# Patient Record
Sex: Male | Born: 1950
Health system: Southern US, Community
[De-identification: ages and names within clinical notes are randomized; demographics above are authoritative.]

## PROBLEM LIST (undated history)

## (undated) DIAGNOSIS — I10 Essential (primary) hypertension: Secondary | ICD-10-CM

## (undated) HISTORY — DX: Essential (primary) hypertension: I10

## (undated) HISTORY — PX: SPINE SURGERY: SHX786

---

## 1974-04-29 HISTORY — PX: APPENDECTOMY: SHX54

## 1994-04-29 HISTORY — PX: BACK SURGERY: SHX140

## 2011-02-11 ENCOUNTER — Encounter: Payer: Self-pay | Admitting: Gastroenterology

## 2011-03-07 ENCOUNTER — Ambulatory Visit (AMBULATORY_SURGERY_CENTER): Payer: BC Managed Care – PPO | Admitting: *Deleted

## 2011-03-07 ENCOUNTER — Encounter: Payer: Self-pay | Admitting: Gastroenterology

## 2011-03-07 VITALS — Ht 70.0 in | Wt 175.0 lb

## 2011-03-07 DIAGNOSIS — Z1211 Encounter for screening for malignant neoplasm of colon: Secondary | ICD-10-CM

## 2011-03-07 MED ORDER — PEG-KCL-NACL-NASULF-NA ASC-C 100 G PO SOLR: ORAL | Status: DC

## 2011-03-18 ENCOUNTER — Encounter: Payer: Self-pay | Admitting: Gastroenterology

## 2011-03-18 ENCOUNTER — Ambulatory Visit (AMBULATORY_SURGERY_CENTER): Payer: BC Managed Care – PPO | Admitting: Gastroenterology

## 2011-03-18 VITALS — BP 160/98 | HR 77 | Temp 97.2°F | Resp 14 | Ht 70.0 in | Wt 175.0 lb

## 2011-03-18 DIAGNOSIS — Z1211 Encounter for screening for malignant neoplasm of colon: Secondary | ICD-10-CM

## 2011-03-18 MED ORDER — SODIUM CHLORIDE 0.9 % IV SOLN: 500.0000 mL | INTRAVENOUS | Status: DC

## 2011-03-18 NOTE — Patient Instructions (Signed)
PLEASE FOLLOW DISCHARGE INSTRUCTIONS GIVEN TODAY. NO DRINKING, NO WORKING, NO DRIVING REST OF TODAY. RESUME NORMAL ACTIVITY TOMORROW. SEE HANDOUTS. RESUME CURRENT MEDICATIONS. CALL us WITH ANY QUESTIONS OR CONCERNS. THANK YOU!!

## 2011-03-18 NOTE — Progress Notes (Signed)
Patient did not experience any of the following events: a burn prior to discharge; a fall within the facility; wrong site/side/patient/procedure/implant event; or a hospital transfer or hospital admission upon discharge from the facility. (G8907) Patient did not have preoperative order for IV antibiotic SSI prophylaxis. (G8918)  

## 2011-03-19 ENCOUNTER — Telehealth: Payer: Self-pay

## 2011-03-19 NOTE — Telephone Encounter (Signed)

## 2012-01-06 ENCOUNTER — Other Ambulatory Visit: Payer: Self-pay | Admitting: Family Medicine

## 2012-01-06 DIAGNOSIS — R252 Cramp and spasm: Secondary | ICD-10-CM

## 2012-01-10 ENCOUNTER — Ambulatory Visit (HOSPITAL_COMMUNITY)
Admission: RE | Admit: 2012-01-10 | Discharge: 2012-01-10 | Disposition: A | Discharge status: Home / Self Care | Payer: BC Managed Care – PPO | Source: Ambulatory Visit | Attending: Family Medicine | Admitting: Family Medicine

## 2012-01-10 DIAGNOSIS — I739 Peripheral vascular disease, unspecified: Secondary | ICD-10-CM | POA: Insufficient documentation

## 2012-01-10 DIAGNOSIS — R252 Cramp and spasm: Secondary | ICD-10-CM

## 2012-01-10 DIAGNOSIS — M79609 Pain in unspecified limb: Secondary | ICD-10-CM | POA: Insufficient documentation

## 2013-03-24 ENCOUNTER — Other Ambulatory Visit: Payer: Self-pay | Admitting: Family Medicine

## 2013-09-09 ENCOUNTER — Encounter: Payer: Self-pay | Admitting: Nurse Practitioner

## 2013-09-09 ENCOUNTER — Telehealth: Payer: Self-pay | Admitting: Family Medicine

## 2013-09-09 ENCOUNTER — Ambulatory Visit (INDEPENDENT_AMBULATORY_CARE_PROVIDER_SITE_OTHER): Payer: BC Managed Care – PPO | Admitting: Nurse Practitioner

## 2013-09-09 VITALS — BP 164/76 | HR 78 | Temp 98.2°F | Ht 70.0 in | Wt 186.0 lb

## 2013-09-09 DIAGNOSIS — J019 Acute sinusitis, unspecified: Secondary | ICD-10-CM

## 2013-09-09 MED ORDER — AZITHROMYCIN 250 MG PO TABS: ORAL_TABLET | ORAL | Status: DC

## 2013-09-09 NOTE — Progress Notes (Signed)
   Subjective:    Patient ID: Christopher Herring, male    DOB: 1950-05-19, 63 y.o.   MRN: [id number]  HPI Patient in today c/o cough and congestion- Started 1 week ago- a little better but still feels bad.    Review of Systems  Constitutional: Positive for fatigue. Negative for fever and chills.  HENT: Positive for congestion, postnasal drip, rhinorrhea and sinus pressure. Negative for sore throat, trouble swallowing and voice change.   Respiratory: Positive for cough (clear).   Cardiovascular: Negative.   Genitourinary: Negative.   Neurological: Negative.   Psychiatric/Behavioral: Negative.        Objective:   Physical Exam  Constitutional: He is oriented to person, place, and time. He appears well-developed and well-nourished.  HENT:  Right Ear: Hearing, tympanic membrane, external ear and ear canal normal.  Left Ear: Hearing, tympanic membrane, external ear and ear canal normal.  Nose: Mucosal edema and rhinorrhea present. Right sinus exhibits maxillary sinus tenderness. Right sinus exhibits no frontal sinus tenderness. Left sinus exhibits maxillary sinus tenderness. Left sinus exhibits no frontal sinus tenderness.  Mouth/Throat: Uvula is midline, oropharynx is clear and moist and mucous membranes are normal.  Eyes: Pupils are equal, round, and reactive to light.  Neck: Normal range of motion. Neck supple.  Cardiovascular: Normal rate, regular rhythm and normal heart sounds.   Pulmonary/Chest: Effort normal and breath sounds normal.  Deep dry cough  Abdominal: Soft. Bowel sounds are normal.  Lymphadenopathy:    He has no cervical adenopathy.  Neurological: He is alert and oriented to person, place, and time.  Skin: Skin is warm and dry.  Psychiatric: He has a normal mood and affect. His behavior is normal. Judgment and thought content normal.   BP 164/76  Pulse 78  Temp(Src) 98.2 F (36.8 C) (Oral)  Ht 5\' 10"  (1.778 m)  Wt 186 lb (84.369 kg)  BMI 26.69 kg/m2          Assessment & Plan:   1. Acute sinusitis    Meds ordered this encounter  Medications  . acyclovir (ZOVIRAX) 400 MG tablet    Sig: Take 400 mg by mouth 2 (two) times daily as needed.  Marland Kitchen azithromycin (ZITHROMAX Z-PAK) 250 MG tablet    Sig: As directed    Dispense:  6 each    Refill:  0    Order Specific Question:  Supervising Provider    Answer:  Chipper Herb [[id number]]   1. Take meds as prescribed 2. Use a cool mist humidifier especially during the winter months and when heat has been humid. 3. Use saline nose sprays frequently 4. Saline irrigations of the nose can be very helpful if done frequently.  * 4X daily for 1 week*  * Use of a nettie pot can be helpful with this. Follow directions with this* 5. Drink plenty of fluids 6. Keep thermostat turn down low 7.For any cough or congestion  Use plain Mucinex- regular strength or max strength is fine   * Children- consult with Pharmacist for dosing 8. For fever or aces or pains- take tylenol or ibuprofen appropriate for age and weight.  * for fevers greater than 101 orally you may alternate ibuprofen and tylenol every  3 hours.   Mary-Margaret Hassell Done, FNP

## 2013-09-09 NOTE — Patient Instructions (Signed)

## 2013-09-09 NOTE — Telephone Encounter (Signed)
appt made

## 2014-03-18 ENCOUNTER — Telehealth: Payer: Self-pay | Admitting: Nurse Practitioner

## 2014-03-18 MED ORDER — ACYCLOVIR 400 MG PO TABS: 400.0000 mg | ORAL_TABLET | Freq: Two times a day (BID) | ORAL | Status: DC | PRN

## 2014-03-18 NOTE — Telephone Encounter (Signed)
Stp, no ear drops on file, advised pt we can give enough refills on acyclovir to get him through till he sees dr Sabra Heck but he would need to be evaluated for the ear pain to get any type of ear drops, pt states he will just use salve on ear and will CB if it gets worse.

## 2014-06-01 ENCOUNTER — Ambulatory Visit: Payer: BC Managed Care – PPO | Admitting: Family Medicine

## 2015-01-05 ENCOUNTER — Ambulatory Visit (INDEPENDENT_AMBULATORY_CARE_PROVIDER_SITE_OTHER): Payer: Medicare Other | Admitting: Family Medicine

## 2015-01-05 ENCOUNTER — Encounter: Payer: Self-pay | Admitting: Family Medicine

## 2015-01-05 ENCOUNTER — Encounter (INDEPENDENT_AMBULATORY_CARE_PROVIDER_SITE_OTHER): Payer: Self-pay

## 2015-01-05 VITALS — BP 123/80 | HR 78 | Temp 98.0°F | Ht 70.0 in | Wt 177.2 lb

## 2015-01-05 DIAGNOSIS — Z125 Encounter for screening for malignant neoplasm of prostate: Secondary | ICD-10-CM | POA: Insufficient documentation

## 2015-01-05 DIAGNOSIS — Z8249 Family history of ischemic heart disease and other diseases of the circulatory system: Secondary | ICD-10-CM

## 2015-01-05 DIAGNOSIS — N400 Enlarged prostate without lower urinary tract symptoms: Secondary | ICD-10-CM | POA: Diagnosis not present

## 2015-01-05 DIAGNOSIS — R002 Palpitations: Secondary | ICD-10-CM | POA: Diagnosis not present

## 2015-01-05 DIAGNOSIS — Z716 Tobacco abuse counseling: Secondary | ICD-10-CM | POA: Diagnosis not present

## 2015-01-05 DIAGNOSIS — Z13220 Encounter for screening for lipoid disorders: Secondary | ICD-10-CM | POA: Insufficient documentation

## 2015-01-05 DIAGNOSIS — Z720 Tobacco use: Secondary | ICD-10-CM

## 2015-01-05 DIAGNOSIS — Z0000 Encounter for general adult medical examination without abnormal findings: Secondary | ICD-10-CM | POA: Insufficient documentation

## 2015-01-05 DIAGNOSIS — Z131 Encounter for screening for diabetes mellitus: Secondary | ICD-10-CM | POA: Insufficient documentation

## 2015-01-05 MED ORDER — VARENICLINE TARTRATE 0.5 MG X 11 & 1 MG X 42 PO MISC: ORAL | Status: DC

## 2015-01-06 ENCOUNTER — Other Ambulatory Visit (INDEPENDENT_AMBULATORY_CARE_PROVIDER_SITE_OTHER): Payer: Medicare Other

## 2015-01-06 DIAGNOSIS — N400 Enlarged prostate without lower urinary tract symptoms: Secondary | ICD-10-CM | POA: Diagnosis not present

## 2015-01-06 DIAGNOSIS — R002 Palpitations: Secondary | ICD-10-CM | POA: Diagnosis not present

## 2015-01-06 DIAGNOSIS — Z136 Encounter for screening for cardiovascular disorders: Secondary | ICD-10-CM | POA: Diagnosis not present

## 2015-01-06 DIAGNOSIS — Z8249 Family history of ischemic heart disease and other diseases of the circulatory system: Secondary | ICD-10-CM

## 2015-01-06 DIAGNOSIS — E785 Hyperlipidemia, unspecified: Secondary | ICD-10-CM

## 2015-01-06 NOTE — Progress Notes (Signed)
Lab only 

## 2015-01-07 LAB — LIPID PANEL
CHOLESTEROL TOTAL: 235 mg/dL — AB (ref 100–199)
Chol/HDL Ratio: 3.9 ratio units (ref 0.0–5.0)
HDL: 61 mg/dL (ref >39)
LDL Calculated: 149 mg/dL — ABNORMAL HIGH (ref 0–99)
TRIGLYCERIDES: 125 mg/dL (ref 0–149)
VLDL Cholesterol Cal: 25 mg/dL (ref 5–40)

## 2015-01-07 LAB — CMP14+EGFR
A/G RATIO: 1.7 (ref 1.1–2.5)
ALBUMIN: 4.5 g/dL (ref 3.6–4.8)
ALT: 17 IU/L (ref 0–44)
AST: 17 IU/L (ref 0–40)
Alkaline Phosphatase: 50 IU/L (ref 39–117)
BILIRUBIN TOTAL: 0.3 mg/dL (ref 0.0–1.2)
BUN/Creatinine Ratio: 21 (ref 10–22)
BUN: 16 mg/dL (ref 8–27)
CHLORIDE: 100 mmol/L (ref 97–108)
CO2: 21 mmol/L (ref 18–29)
Calcium: 9.6 mg/dL (ref 8.6–10.2)
Creatinine, Ser: 0.78 mg/dL (ref 0.76–1.27)
GFR calc non Af Amer: 95 mL/min/{1.73_m2} (ref >59)
GFR, EST AFRICAN AMERICAN: 110 mL/min/{1.73_m2} (ref >59)
Globulin, Total: 2.6 g/dL (ref 1.5–4.5)
Glucose: 97 mg/dL (ref 65–99)
POTASSIUM: 5 mmol/L (ref 3.5–5.2)
Sodium: 141 mmol/L (ref 134–144)
TOTAL PROTEIN: 7.1 g/dL (ref 6.0–8.5)

## 2015-01-07 LAB — TSH: TSH: 3.02 u[IU]/mL (ref 0.450–4.500)

## 2015-01-07 LAB — PSA, TOTAL AND FREE
PSA, Free Pct: 18.3 %
PSA, Free: 0.33 ng/mL
Prostate Specific Ag, Serum: 1.8 ng/mL (ref 0.0–4.0)

## 2015-01-08 NOTE — Assessment & Plan Note (Addendum)
Patient has enlarged prostate. We will check PSA.

## 2015-01-08 NOTE — Assessment & Plan Note (Signed)
Patient here for annual well adult exam and screening labs.

## 2015-01-08 NOTE — Progress Notes (Signed)
BP 123/80 mmHg  Pulse 78  Temp(Src) 98 F (36.7 C) (Oral)  Ht _0  (1.778 m)  Wt 177 lb 3.2 oz (80.377 kg)  BMI 25.43 kg/m2   Subjective:    Patient ID: Christopher Herring, male    DOB: September 27, 1950, 64 y.o.   MRN: [id number]  HPI: Christopher Herring is a 64 y.o. male presenting on 01/05/2015 for Annual Exam; Nicotine Dependence; and Erectile Dysfunction   HPI Annual well exam and labs She presents today for an annual well exam and labs, he is mainly concerned because he does have a family history of heart disease and currently smokes a minutes concerned about his prostate and his levels. He denies any chest pains, shortness of breath, blurred vision, dizziness, headaches. He denies any difficulty starting or stopping his stream would like a prostate check.  Relevant past medical, surgical, family and social history reviewed and updated as indicated. Interim medical history since our last visit reviewed. Allergies and medications reviewed and updated.  Review of Systems  Constitutional: Negative for fever and chills.  HENT: Negative for ear discharge and ear pain.   Eyes: Negative for discharge and visual disturbance.  Respiratory: Negative for cough, chest tightness, shortness of breath and wheezing.   Cardiovascular: Positive for palpitations. Negative for chest pain and leg swelling.  Gastrointestinal: Negative for abdominal pain, diarrhea and constipation.  Endocrine: Negative for cold intolerance, heat intolerance, polydipsia, polyphagia and polyuria.  Genitourinary: Negative for difficulty urinating.  Musculoskeletal: Negative for back pain and gait problem.  Skin: Negative for rash.  Neurological: Negative for dizziness, syncope, light-headedness and headaches.  All other systems reviewed and are negative.   Per HPI unless specifically indicated above     Medication List       This list is accurate as of: 01/05/15 11:59 PM.  Always use your most recent med list.             acyclovir 400 MG tablet  Commonly known as:  ZOVIRAX  Take 1 tablet (400 mg total) by mouth 2 (two) times daily as needed.     varenicline 0.5 MG X 11 & 1 MG X 42 tablet  Commonly known as:  CHANTIX STARTING MONTH PAK  Take one 0.5 mg tablet by mouth once daily for 3 days, then increase to one 0.5 mg tablet twice daily for 4 days, then increase to one 1 mg tablet twice daily.           Objective:    BP 123/80 mmHg  Pulse 78  Temp(Src) 98 F (36.7 C) (Oral)  Ht _1  (1.778 m)  Wt 177 lb 3.2 oz (80.377 kg)  BMI 25.43 kg/m2  Wt Readings from Last 3 Encounters:  01/05/15 177 lb 3.2 oz (80.377 kg)  09/09/13 186 lb (84.369 kg)  03/18/11 175 lb (79.379 kg)    Physical Exam  Constitutional: He is oriented to person, place, and time. He appears well-developed and well-nourished. No distress.  HENT:  Right Ear: External ear normal.  Left Ear: External ear normal.  Nose: Nose normal.  Mouth/Throat: Oropharynx is clear and moist.  Eyes: Conjunctivae and EOM are normal. Pupils are equal, round, and reactive to light. Right eye exhibits no discharge. No scleral icterus.  Neck: Neck supple. No thyromegaly present.  Cardiovascular: Normal rate, regular rhythm, normal heart sounds and intact distal pulses.   No murmur heard. Pulmonary/Chest: Effort normal and breath sounds normal. No respiratory distress. He has no wheezes.  Abdominal: He exhibits no distension.  Genitourinary:  Slightly enlarged prostate, smooth no nodules.  Musculoskeletal: Normal range of motion. He exhibits no edema.  Lymphadenopathy:    He has no cervical adenopathy.  Neurological: He is alert and oriented to person, place, and time. Coordination normal.  Skin: Skin is warm and dry. No rash noted. He is not diaphoretic.  Psychiatric: He has a normal mood and affect. His behavior is normal.  Vitals reviewed.   No results found for this or any previous visit.    Assessment & Plan:   Problem List Items  Addressed This Visit      Other   Well adult exam    Patient here for annual well adult exam and screening labs.      Enlarged prostate    Patient has enlarged prostate. We will check PSA.      Relevant Orders   PSA, total and free (Completed)    Other Visit Diagnoses    Palpitations    -  Primary    Patient has occasional palpitations, he already had an EKG previously, we will check a TSH.    Relevant Orders    TSH (Completed)    Encounter for smoking cessation counseling        Relevant Medications    varenicline (CHANTIX STARTING MONTH PAK) 0.5 MG X 11 & 1 MG X 42 tablet    Family history of heart attack        Patient has a couple of family members that had coronary artery disease, will do screening labs for this.    Relevant Orders    CMP14+EGFR (Completed)    Lipid panel (Completed)        Follow up plan: Return in about 1 year (around 01/05/2016), or if symptoms worsen or fail to improve.  Caryl Pina, MD [facility] 01/05/2015, 9:01 PM

## 2015-01-09 ENCOUNTER — Telehealth: Payer: Self-pay | Admitting: Family Medicine

## 2015-01-09 MED ORDER — ATORVASTATIN CALCIUM 40 MG PO TABS: 40.0000 mg | ORAL_TABLET | Freq: Every day | ORAL | Status: DC

## 2015-01-09 MED ORDER — TADALAFIL 20 MG PO TABS: 10.0000 mg | ORAL_TABLET | ORAL | Status: DC | PRN

## 2015-01-09 NOTE — Addendum Note (Signed)
Addended by: Caryl Pina on: 01/09/2015 08:03 AM   Modules accepted: Orders

## 2015-01-09 NOTE — Addendum Note (Signed)
Addended by: Ilean China on: 01/09/2015 11:57 AM   Modules accepted: Orders

## 2015-01-09 NOTE — Addendum Note (Signed)
Addended by: Caryl Pina on: 01/09/2015 12:48 PM   Modules accepted: Orders, Medications

## 2015-01-10 ENCOUNTER — Telehealth: Payer: Self-pay

## 2015-01-10 NOTE — Telephone Encounter (Signed)
Insurance prior authorized Chantix through 07/09/15

## 2015-01-10 NOTE — Telephone Encounter (Signed)
Mailed pt a copy of his labs/lc

## 2015-01-17 DIAGNOSIS — H532 Diplopia: Secondary | ICD-10-CM | POA: Diagnosis not present

## 2015-01-17 DIAGNOSIS — H2513 Age-related nuclear cataract, bilateral: Secondary | ICD-10-CM | POA: Diagnosis not present

## 2015-01-18 DIAGNOSIS — K1329 Other disturbances of oral epithelium, including tongue: Secondary | ICD-10-CM | POA: Diagnosis not present

## 2015-01-24 DIAGNOSIS — H6120 Impacted cerumen, unspecified ear: Secondary | ICD-10-CM | POA: Diagnosis not present

## 2015-02-02 DIAGNOSIS — K1329 Other disturbances of oral epithelium, including tongue: Secondary | ICD-10-CM | POA: Diagnosis not present

## 2015-02-08 ENCOUNTER — Encounter: Payer: Self-pay | Admitting: Nurse Practitioner

## 2015-02-08 ENCOUNTER — Ambulatory Visit (INDEPENDENT_AMBULATORY_CARE_PROVIDER_SITE_OTHER): Payer: Medicare Other | Admitting: Nurse Practitioner

## 2015-02-08 VITALS — BP 150/83 | HR 53 | Temp 96.7°F | Ht 70.0 in | Wt 188.0 lb

## 2015-02-08 DIAGNOSIS — H60391 Other infective otitis externa, right ear: Secondary | ICD-10-CM | POA: Diagnosis not present

## 2015-02-08 DIAGNOSIS — I10 Essential (primary) hypertension: Secondary | ICD-10-CM | POA: Diagnosis not present

## 2015-02-08 MED ORDER — CIPROFLOXACIN-DEXAMETHASONE 0.3-0.1 % OT SUSP: 4.0000 [drp] | Freq: Two times a day (BID) | OTIC | Status: DC

## 2015-02-08 NOTE — Patient Instructions (Signed)
Hypertension Hypertension, commonly called high blood pressure, is when the force of blood pumping through your arteries is too strong. Your arteries are the blood vessels that carry blood from your heart throughout your body. A blood pressure reading consists of a higher number over a lower number, such as 110/72. The higher number (systolic) is the pressure inside your arteries when your heart pumps. The lower number (diastolic) is the pressure inside your arteries when your heart relaxes. Ideally you want your blood pressure below 120/80. Hypertension forces your heart to work harder to pump blood. Your arteries may become narrow or stiff. Having untreated or uncontrolled hypertension can cause heart attack, stroke, kidney disease, and other problems. RISK FACTORS Some risk factors for high blood pressure are controllable. Others are not.  Risk factors you cannot control include:   Race. You may be at higher risk if you are African American.  Age. Risk increases with age.  Gender. Men are at higher risk than women before age 45 years. After age 65, women are at higher risk than men. Risk factors you can control include:  Not getting enough exercise or physical activity.  Being overweight.  Getting too much fat, sugar, calories, or salt in your diet.  Drinking too much alcohol. SIGNS AND SYMPTOMS Hypertension does not usually cause signs or symptoms. Extremely high blood pressure (hypertensive crisis) may cause headache, anxiety, shortness of breath, and nosebleed. DIAGNOSIS To check if you have hypertension, your health care provider will measure your blood pressure while you are seated, with your arm held at the level of your heart. It should be measured at least twice using the same arm. Certain conditions can cause a difference in blood pressure between your right and left arms. A blood pressure reading that is higher than normal on one occasion does not mean that you need treatment. If  it is not clear whether you have high blood pressure, you may be asked to return on a different day to have your blood pressure checked again. Or, you may be asked to monitor your blood pressure at home for 1 or more weeks. TREATMENT Treating high blood pressure includes making lifestyle changes and possibly taking medicine. Living a healthy lifestyle can help lower high blood pressure. You may need to change some of your habits. Lifestyle changes may include:  Following the DASH diet. This diet is high in fruits, vegetables, and whole grains. It is low in salt, red meat, and added sugars.  Keep your sodium intake below 2,300 mg per day.  Getting at least 30-45 minutes of aerobic exercise at least 4 times per week.  Losing weight if necessary.  Not smoking.  Limiting alcoholic beverages.  Learning ways to reduce stress. Your health care provider may prescribe medicine if lifestyle changes are not enough to get your blood pressure under control, and if one of the following is true:  You are 18-59 years of age and your systolic blood pressure is above 140.  You are 60 years of age or older, and your systolic blood pressure is above 150.  Your diastolic blood pressure is above 90.  You have diabetes, and your systolic blood pressure is over 140 or your diastolic blood pressure is over 90.  You have kidney disease and your blood pressure is above 140/90.  You have heart disease and your blood pressure is above 140/90. Your personal target blood pressure may vary depending on your medical conditions, your age, and other factors. HOME CARE INSTRUCTIONS    Have your blood pressure rechecked as directed by your health care provider.   Take medicines only as directed by your health care provider. Follow the directions carefully. Blood pressure medicines must be taken as prescribed. The medicine does not work as well when you skip doses. Skipping doses also puts you at risk for  problems.  Do not smoke.   Monitor your blood pressure at home as directed by your health care provider. SEEK MEDICAL CARE IF:   You think you are having a reaction to medicines taken.  You have recurrent headaches or feel dizzy.  You have swelling in your ankles.  You have trouble with your vision. SEEK IMMEDIATE MEDICAL CARE IF:  You develop a severe headache or confusion.  You have unusual weakness, numbness, or feel faint.  You have severe chest or abdominal pain.  You vomit repeatedly.  You have trouble breathing. MAKE SURE YOU:   Understand these instructions.  Will watch your condition.  Will get help right away if you are not doing well or get worse.   This information is not intended to replace advice given to you by your health care provider. Make sure you discuss any questions you have with your health care provider.   Document Released: 04/15/2005 Document Revised: 08/30/2014 Document Reviewed: 02/05/2013 Elsevier Interactive Patient Education 2016 Elsevier Inc.  

## 2015-02-08 NOTE — Progress Notes (Signed)
  Subjective:     Christopher Herring is a 64 y.o. male who presents with ear pain and possible ear infection. Symptoms include: bilateral ear pain and congestion. Onset of symptoms was 2 weeks ago, and have been gradually worsening since that time. Associated symptoms include: none.  Patient denies: achiness, chills, fever , headache, post nasal drip, productive cough and sinus pressure. He is drinking plenty of fluids. * went to urgent care 2 weeks ago and had cerumen cleaned out of right ear and started bothering him again 4 days ago.  The following portions of the patient's history were reviewed and updated as appropriate: allergies, current medications, past family history, past medical history, past social history, past surgical history and problem list.  Review of Systems Pertinent items are noted in HPI.   Objective:     General:  alert and cooperative  Right Ear:  TM not very visible- yellowish excudate with tragus tenderness  Left Ear: normal landmarks and mobility and normal mobility  Mouth:  lips, mucosa, and tongue normal; teeth and gums normal  Neck: no adenopathy, no carotid bruit, no JVD, supple, symmetrical, trachea midline and thyroid not enlarged, symmetric, no tenderness/mass/nodules     Assessment:    Right acute otitis externa   Hypertension  Plan:   1. Otitis, externa, infective, right Avoid getting water in ears - ciprofloxacin-dexamethasone (CIPRODEX) otic suspension; Place 4 drops into both ears 2 (two) times daily.  Dispense: 7.5 mL; Refill: 0  2. Essential hypertension Do not add salt to diet Patient wants to monitor blood pressure at home for 2 weeks RTO in 2 weeks to recheck  [place], FNP

## 2015-02-13 DIAGNOSIS — K1329 Other disturbances of oral epithelium, including tongue: Secondary | ICD-10-CM | POA: Diagnosis not present

## 2015-02-14 DIAGNOSIS — K1329 Other disturbances of oral epithelium, including tongue: Secondary | ICD-10-CM | POA: Diagnosis not present

## 2015-03-21 ENCOUNTER — Telehealth: Payer: Self-pay | Admitting: Family Medicine

## 2015-06-09 DIAGNOSIS — S61214A Laceration without foreign body of right ring finger without damage to nail, initial encounter: Secondary | ICD-10-CM | POA: Diagnosis not present

## 2015-07-28 DIAGNOSIS — H25813 Combined forms of age-related cataract, bilateral: Secondary | ICD-10-CM | POA: Diagnosis not present

## 2015-07-28 DIAGNOSIS — H52223 Regular astigmatism, bilateral: Secondary | ICD-10-CM | POA: Diagnosis not present

## 2015-07-28 DIAGNOSIS — H5203 Hypermetropia, bilateral: Secondary | ICD-10-CM | POA: Diagnosis not present

## 2015-07-28 DIAGNOSIS — H524 Presbyopia: Secondary | ICD-10-CM | POA: Diagnosis not present

## 2015-10-27 ENCOUNTER — Ambulatory Visit (INDEPENDENT_AMBULATORY_CARE_PROVIDER_SITE_OTHER): Payer: Medicare Other | Admitting: Nurse Practitioner

## 2015-10-27 ENCOUNTER — Ambulatory Visit (INDEPENDENT_AMBULATORY_CARE_PROVIDER_SITE_OTHER): Payer: Medicare Other

## 2015-10-27 ENCOUNTER — Encounter: Payer: Self-pay | Admitting: Nurse Practitioner

## 2015-10-27 VITALS — BP 135/75 | HR 92 | Temp 97.3°F | Ht 70.0 in | Wt 195.2 lb

## 2015-10-27 DIAGNOSIS — M5442 Lumbago with sciatica, left side: Secondary | ICD-10-CM | POA: Diagnosis not present

## 2015-10-27 MED ORDER — CYCLOBENZAPRINE HCL 5 MG PO TABS
5.0000 mg | ORAL_TABLET | Freq: Three times a day (TID) | ORAL | Status: DC | PRN
Start: 2015-10-27 — End: 2017-06-09

## 2015-10-27 MED ORDER — METHYLPREDNISOLONE ACETATE 80 MG/ML IJ SUSP
80.0000 mg | Freq: Once | INTRAMUSCULAR | Status: AC
  Administered 2015-10-27: 80 mg via INTRAMUSCULAR

## 2015-10-27 NOTE — Patient Instructions (Signed)

## 2015-10-27 NOTE — Progress Notes (Signed)
   Subjective:    Patient ID: Christopher Herring, male    DOB: 04-21-1951, 65 y.o.   MRN: [id number]  HPI  Patient in c/o lower back pain on keft side- denies any injury- started first part of week- pain is intermittent in left lower back and radiates to buttocks and down left leg slightly- rates pain 5/10- heating pad and sitting still helps pain- nothing seems to make it worse. He has tired back exercises and motrin 800mg   Which has not helped.   Review of Systems  Constitutional: Negative.   HENT: Negative.   Respiratory: Negative.   Cardiovascular: Negative.   Genitourinary: Negative.   Neurological: Negative.   Psychiatric/Behavioral: Negative.   All other systems reviewed and are negative.      Objective:   Physical Exam  Constitutional: He is oriented to person, place, and time. He appears well-developed and well-nourished. No distress.  Cardiovascular: Normal rate, regular rhythm and normal heart sounds.   Pulmonary/Chest: Effort normal and breath sounds normal.  Abdominal: Soft. Bowel sounds are normal.  Musculoskeletal:  FROM of lumbar spine without pain No pain on palpation (-) SLR bil Motor strenghth and sensation distally intact  Neurological: He is alert and oriented to person, place, and time. He has normal reflexes.  Skin: Skin is warm.  Psychiatric: He has a normal mood and affect. His behavior is normal. Judgment normal.   BP 135/75 mmHg  Pulse 92  Temp(Src) 97.3 F (36.3 C) (Oral)  Ht 5\' 10"  (1.778 m)  Wt 195 lb 3.2 oz (88.542 kg)  BMI 28.01 kg/m2  Lumbar x ray- DDD lumbar spine-Preliminary reading by Ronnald Collum, FNP  [facility]       Assessment & Plan:  1. Left-sided low back pain with left-sided sciatica Moist heat Back stretching exercises RTO prn - DG Lumbar Spine 2-3 Views; Future - methylPREDNISolone acetate (DEPO-MEDROL) injection 80 mg; Inject 1 mL (80 mg total) into the muscle once. - cyclobenzaprine (FLEXERIL) 5 MG tablet; Take 1 tablet (5 mg  total) by mouth 3 (three) times daily as needed for muscle spasms.  Dispense: 30 tablet; Refill: 0  Mary-Margaret Hassell Done, FNP

## 2016-01-06 ENCOUNTER — Ambulatory Visit (INDEPENDENT_AMBULATORY_CARE_PROVIDER_SITE_OTHER): Payer: Medicare Other | Admitting: Nurse Practitioner

## 2016-01-06 ENCOUNTER — Encounter: Payer: Self-pay | Admitting: Nurse Practitioner

## 2016-01-06 VITALS — BP 146/88 | HR 81 | Temp 97.0°F | Ht 70.0 in | Wt 189.0 lb

## 2016-01-06 DIAGNOSIS — R208 Other disturbances of skin sensation: Secondary | ICD-10-CM | POA: Diagnosis not present

## 2016-01-06 DIAGNOSIS — R5383 Other fatigue: Secondary | ICD-10-CM

## 2016-01-06 DIAGNOSIS — R200 Anesthesia of skin: Secondary | ICD-10-CM

## 2016-01-06 NOTE — Progress Notes (Signed)
   Subjective:    Patient ID: Christopher Herring, male    DOB: [birth year], 65 y.o.   MRN: [id number]  HPI Patient comes in with his wife stating that when he woke up this morning he just did not feel right. He said that both of his upper arms were numb and he felt a little strange- No SOB or chest pain- he does say that he gets fatigued very easily and cannot seem to do as much as he use to. Has to go to [facility] next week and he wanted to make sur ehe was fine.    Review of Systems  Constitutional: Negative for activity change, appetite change and diaphoresis.  HENT: Negative.   Respiratory: Negative.   Cardiovascular: Negative for chest pain, palpitations and leg swelling.  Gastrointestinal: Negative.   Genitourinary: Negative.   Neurological: Negative.   Psychiatric/Behavioral: Negative.   All other systems reviewed and are negative.      Objective:   Physical Exam  Constitutional: He is oriented to person, place, and time. He appears well-developed and well-nourished.  Cardiovascular: Normal rate, regular rhythm and normal heart sounds.   Pulmonary/Chest: Effort normal and breath sounds normal.  Abdominal: Soft. Bowel sounds are normal.  Musculoskeletal: Normal range of motion.  Neurological: He is alert and oriented to person, place, and time.  Skin: Skin is warm.  Psychiatric: He has a normal mood and affect. His behavior is normal. Judgment and thought content normal.   BP (!) 146/88 (BP Location: Left Arm, Cuff Size: Normal)   Pulse 81   Temp 97 F (36.1 C) (Oral)   Ht 5\' 10"  (1.778 m)   Wt 189 lb (85.7 kg)   BMI 27.12 kg/m   EKG normal       Assessment & Plan:   1. Arm numbness   2. Decreased stamina    Recommend STress test soon- will let Dr. Venancio Poisson know and schedule reat  to ER if develops chest pain Avoid caffeine Stop Smoking  Mary-Margaret Hassell Done, FNP

## 2016-01-08 ENCOUNTER — Telehealth: Payer: Self-pay | Admitting: *Deleted

## 2016-01-08 ENCOUNTER — Telehealth: Payer: Self-pay | Admitting: Family Medicine

## 2016-01-08 NOTE — Telephone Encounter (Signed)
-----   Message from Worthy Rancher, MD sent at 01/08/2016  8:08 AM EDT ----- Go ahead and schedule for stress treadmill  ----- Message ----- From: Chevis Pretty, FNP Sent: 01/06/2016  10:44 AM To: Fransisca Kaufmann Dettinger, MD  Saw patien on Saturday with numbness bil upper ext and just not feeling right- he did have increasing fatigue or stamina decreasing- I feel he needs stress test- can you reviw and schedule and let patient know or I can refer him if you would like.

## 2016-01-15 ENCOUNTER — Telehealth: Payer: Self-pay | Admitting: Family Medicine

## 2016-01-16 NOTE — Telephone Encounter (Signed)
Appointment scheduled for 12/7 @ 9:30. Instructions mailed to patient.

## 2016-01-29 ENCOUNTER — Other Ambulatory Visit: Payer: Self-pay | Admitting: Nurse Practitioner

## 2016-03-25 ENCOUNTER — Ambulatory Visit (INDEPENDENT_AMBULATORY_CARE_PROVIDER_SITE_OTHER): Payer: Medicare Other | Admitting: Family

## 2016-03-25 VITALS — BP 123/71 | HR 78 | Temp 97.0°F | Ht 70.0 in | Wt 187.8 lb

## 2016-03-25 DIAGNOSIS — H6121 Impacted cerumen, right ear: Secondary | ICD-10-CM | POA: Diagnosis not present

## 2016-03-25 NOTE — Progress Notes (Signed)
   Subjective:    Patient ID: TAVIER NEWBORN, male    DOB: [birth year], 65 y.o.   MRN: [id number]  Ear Fullness   There is pain in the right ear. This is a new problem. The current episode started in the past 7 days. The problem occurs constantly. There has been no fever. The patient is experiencing no pain. Associated symptoms include hearing loss. Pertinent negatives include no coughing or ear discharge. He has tried ear drops for the symptoms. The treatment provided no relief.      Review of Systems  HENT: Positive for hearing loss. Negative for ear discharge.   Respiratory: Negative for cough.   All other systems reviewed and are negative.      Objective:   Physical Exam  Constitutional: He is oriented to person, place, and time. He appears well-developed and well-nourished. No distress.  HENT:  Head: Normocephalic.  Left Ear: External ear normal.  Mouth/Throat: Oropharynx is clear and moist.  Right ear cerumen impactation  Eyes: Pupils are equal, round, and reactive to light.  Cardiovascular: Normal rate, regular rhythm, normal heart sounds and intact distal pulses.   No murmur heard. Pulmonary/Chest: He has no wheezes.  Abdominal: Soft. Bowel sounds are normal. He exhibits no distension. There is no tenderness.  Musculoskeletal: Normal range of motion. He exhibits no edema or tenderness.  Neurological: He is alert and oriented to person, place, and time.  Skin: Skin is warm and dry. No rash noted. No erythema.  Psychiatric: He has a normal mood and affect. His behavior is normal. Judgment and thought content normal.  Vitals reviewed.   BP 123/71 (BP Location: Left Arm, Patient Position: Sitting, Cuff Size: Normal)   Pulse 78   Temp 97 F (36.1 C) (Oral)   Ht 5\' 10"  (1.778 m)   Wt 187 lb 12.8 oz (85.2 kg)   BMI 26.95 kg/m   Warm water and peroxide into right ear. Cerumen removed. TM WNL     Assessment & Plan:  1. Impacted cerumen of right ear -Keep clean and  dry -OTC ear drops -Do not stick anything into ear RTO prn   Evelina Dun, FNP

## 2016-03-25 NOTE — Patient Instructions (Signed)
Earwax Buildup Your ears make a substance called earwax. It may also be called cerumen. Sometimes, too much earwax builds up in your ear canal. This can cause ear pain and make it harder for you to hear. CAUSES This condition is caused by too much earwax production or buildup. RISK FACTORS The following factors may make you more likely to develop this condition:  Cleaning your ears often with swabs.  Having narrow ear canals.  Having earwax that is overly thick or sticky.  Having eczema.  Being dehydrated. SYMPTOMS Symptoms of this condition include:  Reduced hearing.  Ear drainage.  Ear pain.  Ear itch.  A feeling of fullness in the ear or feeling that the ear is plugged.  Ringing in the ear.  Coughing. DIAGNOSIS Your health care provider can diagnose this condition based on your symptoms and medical history. Your health care provider will also do an ear exam to look inside your ear with a scope (otoscope). You may also have a hearing test. TREATMENT Treatment for this condition includes:  Over-the-counter or prescription ear drops to soften the earwax.  Earwax removal by a health care provider. This may be done:  By flushing the ear with body-temperature water.  With a medical instrument that has a loop at the end (earwax curette).  With a suction device. HOME CARE INSTRUCTIONS  Take over-the-counter and prescription medicines only as told by your health care provider.  Do not put any objects, including an ear swab, into your ear. You can clean the opening of your ear canal with a washcloth.  Drink enough water to keep your urine clear or pale yellow.  If you have frequent earwax buildup or you use hearing aids, consider seeing your health care provider every 6-12 months for routine preventive ear cleanings. Keep all follow-up visits as told by your health care provider. SEEK MEDICAL CARE IF:  You have ear pain.  Your condition does not improve with  treatment.  You have hearing loss.  You have blood, pus, or other fluid coming from your ear. This information is not intended to replace advice given to you by your health care provider. Make sure you discuss any questions you have with your health care provider. Document Released: 05/23/2004 Document Revised: 08/07/2015 Document Reviewed: 11/30/2014 Elsevier Interactive Patient Education  2017 Elsevier Inc.  

## 2016-04-15 ENCOUNTER — Encounter: Payer: Self-pay | Admitting: Family Medicine

## 2016-04-15 ENCOUNTER — Ambulatory Visit (INDEPENDENT_AMBULATORY_CARE_PROVIDER_SITE_OTHER): Payer: Medicare Other | Admitting: Family Medicine

## 2016-04-15 VITALS — BP 131/89 | HR 67 | Temp 97.5°F | Ht 70.0 in | Wt 190.0 lb

## 2016-04-15 DIAGNOSIS — H60331 Swimmer's ear, right ear: Secondary | ICD-10-CM

## 2016-04-15 MED ORDER — NEOMYCIN-POLYMYXIN-HC 3.5-10000-1 OT SOLN: 4.0000 [drp] | Freq: Three times a day (TID) | OTIC | 0 refills | Status: DC

## 2016-04-15 NOTE — Progress Notes (Signed)
BP 131/89   Pulse 67   Temp 97.5 F (36.4 C) (Oral)   Ht 5\' 10"  (1.778 m)   Wt 190 lb (86.2 kg)   BMI 27.26 kg/m    Subjective:    Patient ID: Christopher Herring, male    DOB: 1950/08/21, 65 y.o.   MRN: [id number]  HPI: Christopher Herring is a 65 y.o. male presenting on 04/15/2016 for Right ear pain (had ear cleaned out about 3 weeks ago, ear has been stopped up and painful since then)   HPI Right ear pain and congestion Patient has been having persistent right ear pain and ear congestion that's been going on for the past 3 weeks. He came in 3 weeks ago was seen in her office for lavage and cerumen disimpaction which he says he has had multiple times previously. This time the pain has persisted over the past few days he has been having some drainage and he feels like his ear is muffled and still cannot hear well out of it. He denies any fevers or chills or cough or nasal congestion or sore throat. He denies any shortness of breath or wheezing. He says mainly is just that ear.  Relevant past medical, surgical, family and social history reviewed and updated as indicated. Interim medical history since our last visit reviewed. Allergies and medications reviewed and updated.  Review of Systems  Constitutional: Negative for chills and fever.  HENT: Positive for ear discharge, ear pain and hearing loss. Negative for congestion, rhinorrhea, sinus pain, sinus pressure, sneezing and sore throat.   Respiratory: Negative for cough, shortness of breath and wheezing.   Cardiovascular: Negative for chest pain and leg swelling.  Musculoskeletal: Negative for back pain and gait problem.  Skin: Negative for rash.  All other systems reviewed and are negative.   Per HPI unless specifically indicated above      Objective:    BP 131/89   Pulse 67   Temp 97.5 F (36.4 C) (Oral)   Ht 5\' 10"  (1.778 m)   Wt 190 lb (86.2 kg)   BMI 27.26 kg/m   Wt Readings from Last 3 Encounters:  04/15/16 190 lb (86.2 kg)   03/25/16 187 lb 12.8 oz (85.2 kg)  01/06/16 189 lb (85.7 kg)    Physical Exam  Constitutional: He is oriented to person, place, and time. He appears well-developed and well-nourished. No distress.  HENT:  Right Ear: There is drainage and swelling (Canal full of purulent and wax, unable to visualize tympanic membrane). Decreased hearing is noted.  Left Ear: Tympanic membrane and ear canal normal. No drainage.  Nose: Nose normal.  Mouth/Throat: Uvula is midline and oropharynx is clear and moist.  Eyes: Conjunctivae are normal. Right eye exhibits no discharge. Left eye exhibits no discharge. No scleral icterus.  Cardiovascular: Normal rate, regular rhythm, normal heart sounds and intact distal pulses.   No murmur heard. Pulmonary/Chest: Effort normal and breath sounds normal. No respiratory distress. He has no wheezes.  Musculoskeletal: Normal range of motion. He exhibits no edema.  Neurological: He is alert and oriented to person, place, and time. Coordination normal.  Skin: Skin is warm and dry. No rash noted. He is not diaphoretic.  Psychiatric: He has a normal mood and affect. His behavior is normal.  Nursing note and vitals reviewed.   Nurse lavage of right ear: Nurse was able to lavage right ear and get a little bit of the drainage and wax out but was very painful  for patient so she was not able to clear completely.    Assessment & Plan:   Problem List Items Addressed This Visit    None    Visit Diagnoses    Acute swimmer's ear of right side    -  Primary   Relevant Medications   neomycin-polymyxin-hydrocortisone (CORTISPORIN) otic solution       Follow up plan: Return if symptoms worsen or fail to improve.  Counseling provided for all of the vaccine components No orders of the defined types were placed in this encounter.   Caryl Pina, MD [facility] 04/15/2016, 9:26 AM

## 2016-04-23 ENCOUNTER — Telehealth: Payer: Self-pay

## 2016-04-23 NOTE — Telephone Encounter (Signed)
Pt is continuing with discomfort in ear and is requesting a referral be placed for ENT. No one specific, but I suggested [facility]

## 2016-04-24 ENCOUNTER — Other Ambulatory Visit: Payer: Self-pay | Admitting: *Deleted

## 2016-04-24 DIAGNOSIS — H9201 Otalgia, right ear: Secondary | ICD-10-CM

## 2016-04-24 NOTE — Telephone Encounter (Signed)
[facility] ahead and place the referral for ENT.

## 2016-04-24 NOTE — Telephone Encounter (Signed)
Referral place to ENT

## 2016-05-14 DIAGNOSIS — T161XXA Foreign body in right ear, initial encounter: Secondary | ICD-10-CM | POA: Diagnosis not present

## 2016-05-14 DIAGNOSIS — H903 Sensorineural hearing loss, bilateral: Secondary | ICD-10-CM | POA: Diagnosis not present

## 2016-05-14 DIAGNOSIS — H9311 Tinnitus, right ear: Secondary | ICD-10-CM | POA: Diagnosis not present

## 2016-05-14 DIAGNOSIS — Z87891 Personal history of nicotine dependence: Secondary | ICD-10-CM | POA: Diagnosis not present

## 2016-05-14 DIAGNOSIS — H6241 Otitis externa in other diseases classified elsewhere, right ear: Secondary | ICD-10-CM | POA: Diagnosis not present

## 2016-05-14 DIAGNOSIS — B369 Superficial mycosis, unspecified: Secondary | ICD-10-CM | POA: Diagnosis not present

## 2016-05-14 DIAGNOSIS — Z974 Presence of external hearing-aid: Secondary | ICD-10-CM | POA: Diagnosis not present

## 2016-05-14 DIAGNOSIS — J343 Hypertrophy of nasal turbinates: Secondary | ICD-10-CM | POA: Diagnosis not present

## 2016-08-20 DIAGNOSIS — H52223 Regular astigmatism, bilateral: Secondary | ICD-10-CM | POA: Diagnosis not present

## 2016-08-20 DIAGNOSIS — H5203 Hypermetropia, bilateral: Secondary | ICD-10-CM | POA: Diagnosis not present

## 2016-08-20 DIAGNOSIS — H524 Presbyopia: Secondary | ICD-10-CM | POA: Diagnosis not present

## 2016-08-20 DIAGNOSIS — H25813 Combined forms of age-related cataract, bilateral: Secondary | ICD-10-CM | POA: Diagnosis not present

## 2016-09-07 IMAGING — DX DG LUMBAR SPINE 2-3V
2 series · 2 of 2 positions shown · non-contrast
Comparison: None.

CLINICAL DATA: Low back pain left-sided.

EXAM:
LUMBAR SPINE - 2-3 VIEW

[l-spine ap]
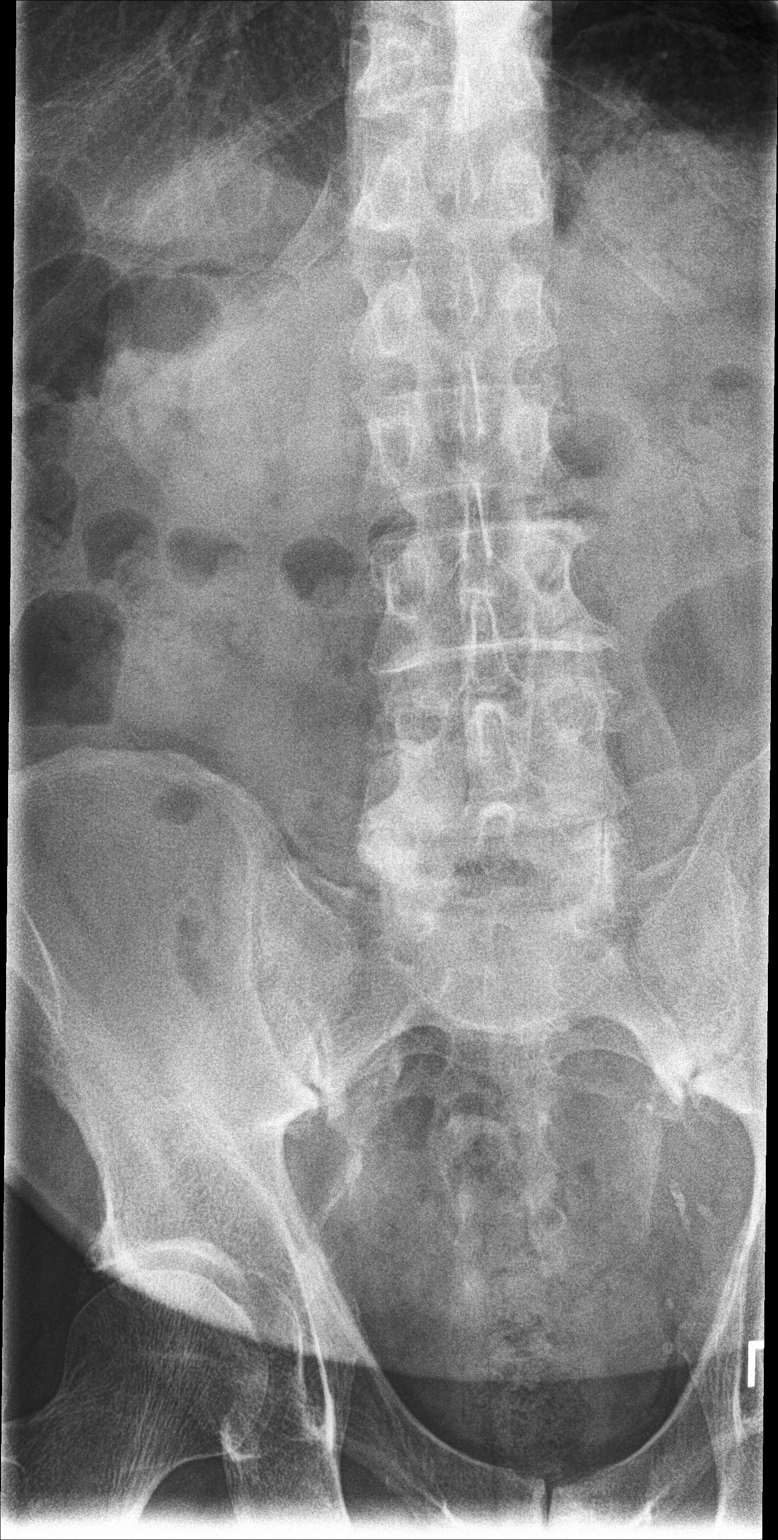

[l-spine lat]
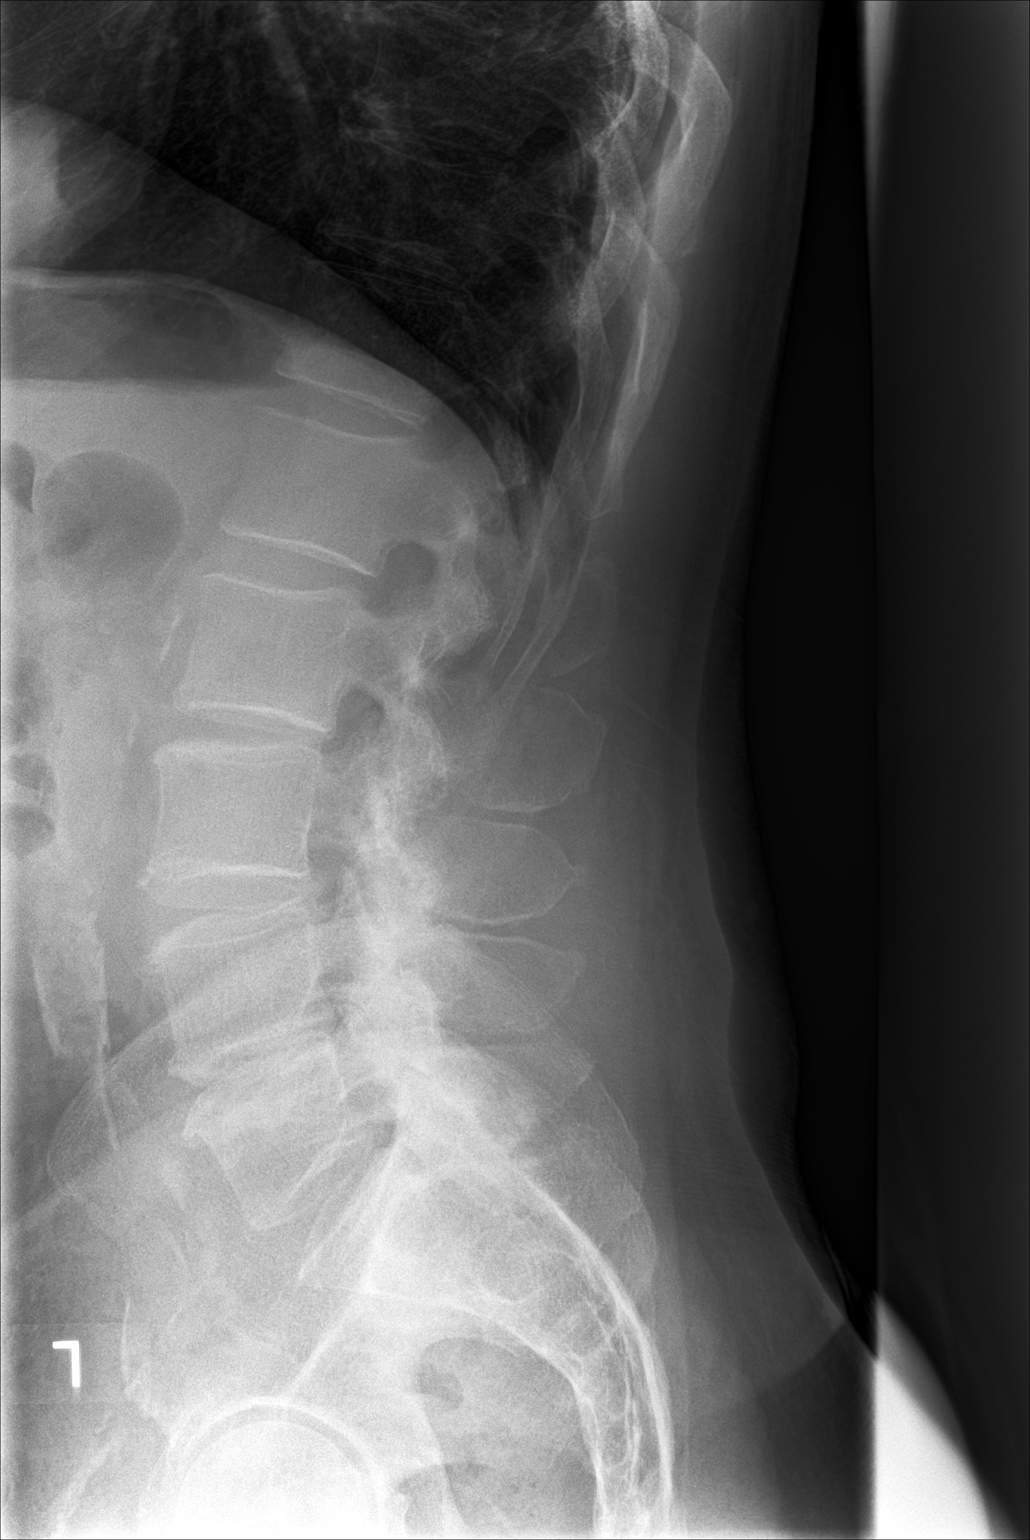

[2 of 2 positions shown; findings below may reference images not displayed]

FINDINGS: Vertebral body alignment and heights are within normal. There is
mild spondylosis of the lumbar spine to include moderate facet
arthropathy over the mid to lower lumbar spine. There is disc space
narrowing at the L4-5 level greater than the L2-3 level. No evidence
of compression fracture or spondylolisthesis. Mild degenerative
change of the inferior sacroiliac joints. Atherosclerotic plaque
over the abdominal aorta.
IMPRESSION: Mild spondylosis of the lumbar spine to include moderate facet
arthropathy over the mid to lower lumbar spine. Disc disease at the
L4-5 level greater than the L2-3 level.

## 2017-02-13 DIAGNOSIS — H60543 Acute eczematoid otitis externa, bilateral: Secondary | ICD-10-CM | POA: Diagnosis not present

## 2017-02-13 DIAGNOSIS — H9211 Otorrhea, right ear: Secondary | ICD-10-CM | POA: Diagnosis not present

## 2017-04-03 ENCOUNTER — Other Ambulatory Visit: Payer: Self-pay

## 2017-06-09 ENCOUNTER — Ambulatory Visit (INDEPENDENT_AMBULATORY_CARE_PROVIDER_SITE_OTHER): Payer: Medicare Other | Admitting: Family Medicine

## 2017-06-09 ENCOUNTER — Encounter: Payer: Self-pay | Admitting: Family Medicine

## 2017-06-09 VITALS — BP 139/82 | HR 67 | Temp 98.3°F | Ht 70.0 in | Wt 181.0 lb

## 2017-06-09 DIAGNOSIS — Z5181 Encounter for therapeutic drug level monitoring: Secondary | ICD-10-CM | POA: Diagnosis not present

## 2017-06-09 DIAGNOSIS — M7021 Olecranon bursitis, right elbow: Secondary | ICD-10-CM | POA: Insufficient documentation

## 2017-06-09 DIAGNOSIS — B001 Herpesviral vesicular dermatitis: Secondary | ICD-10-CM | POA: Diagnosis not present

## 2017-06-09 LAB — CMP14+EGFR
A/G RATIO: 2.1 (ref 1.2–2.2)
ALBUMIN: 4.4 g/dL (ref 3.6–4.8)
ALT: 14 IU/L (ref 0–44)
AST: 16 IU/L (ref 0–40)
Alkaline Phosphatase: 47 IU/L (ref 39–117)
BUN / CREAT RATIO: 14 (ref 10–24)
BUN: 12 mg/dL (ref 8–27)
Bilirubin Total: 0.4 mg/dL (ref 0.0–1.2)
CO2: 22 mmol/L (ref 20–29)
Calcium: 9.4 mg/dL (ref 8.6–10.2)
Chloride: 104 mmol/L (ref 96–106)
Creatinine, Ser: 0.84 mg/dL (ref 0.76–1.27)
GFR, EST AFRICAN AMERICAN: 105 mL/min/{1.73_m2} (ref >59)
GFR, EST NON AFRICAN AMERICAN: 91 mL/min/{1.73_m2} (ref >59)
GLOBULIN, TOTAL: 2.1 g/dL (ref 1.5–4.5)
Glucose: 97 mg/dL (ref 65–99)
POTASSIUM: 4.4 mmol/L (ref 3.5–5.2)
SODIUM: 142 mmol/L (ref 134–144)
TOTAL PROTEIN: 6.5 g/dL (ref 6.0–8.5)

## 2017-06-09 MED ORDER — ACYCLOVIR 400 MG PO TABS: 400.0000 mg | ORAL_TABLET | Freq: Three times a day (TID) | ORAL | 0 refills | Status: DC

## 2017-06-09 NOTE — Progress Notes (Signed)
Subjective: CC: cold sore, elbow pain PCP: Janora Norlander, DO QVZ:DGLO Christopher Herring is a 67 y.o. male presenting to clinic today for:  1. Cold sore Patient reports that he has had oral cold sores for some time now.  He reports he gets them about 4 times per year and can usually feel them coming on.  He describes the sensation as a tingling/itching.  Lesions always occur on the upper medial aspect of the lip.  He reports that this 1 has been present for 4 days.  He has been using Abreva applied to the area.  Normally, he takes acyclovir, which she reports helps relieve it quickly and is less expensive than the Abreva.  He denies fevers, chills, purulence from lesion, significant swelling or pain.  2.  Elbow pain Patient reports intermittent history of right elbow pain.  He points to the tip of the olecranon as a source of pain.  Denies significant swelling, preceding injury.  No loss of function.  He has not done anything for pain except for apply heat .  He is wondering what he can do to help relieve.  ROS: Per HPI  No Known Allergies No past medical history on file.  Current Outpatient Medications:  .  acyclovir (ZOVIRAX) 400 MG tablet, Take 1 tablet (400 mg total) by mouth 3 (three) times daily. x5-10 days (per cold sore episode), Disp: 60 tablet, Rfl: 0 Social History   Socioeconomic History  . Marital status: Married    Spouse name: Not on file  . Number of children: Not on file  . Years of education: Not on file  . Highest education level: Not on file  Social Needs  . Financial resource strain: Not on file  . Food insecurity - worry: Not on file  . Food insecurity - inability: Not on file  . Transportation needs - medical: Not on file  . Transportation needs - non-medical: Not on file  Occupational History  . Not on file  Tobacco Use  . Smoking status: Current Every Day Smoker    Packs/day: 0.75    Years: 50.00    Pack years: 37.50    Types: Cigarettes  . Smokeless  tobacco: Current User    Types: Snuff  Substance and Sexual Activity  . Alcohol use: Yes    Alcohol/week: 6.0 oz    Types: 10 Glasses of wine per week  . Drug use: No  . Sexual activity: Not on file  Other Topics Concern  . Not on file  Social History Narrative  . Not on file   Family History  Problem Relation Age of Onset  . Diabetes Mother   . Alzheimer's disease Mother   . Heart disease Father   . Cancer Sister     Objective: Office vital signs reviewed. BP 139/82   Pulse 67   Temp 98.3 F (36.8 C) (Oral)   Ht '5\' 10"'$  (1.778 m)   Wt 181 lb (82.1 kg)   BMI 25.97 kg/m   Physical Examination:  General: Awake, alert, well nourished, No acute distress HEENT: sclera white, MMM; small ulcerated lesion on the right medial aspect of the upper lip.  No purulence, induration, erythema or bleeding.  No facial swelling. Pulm: normal work of breathing on room air  Extremities: warm, well perfused MSK: Patient has full active range of motion of bilateral upper extremities.  Mild point tenderness at the tip of the olecranon.  No significant swelling or bursal enlargement. Skin: See oral  lesion as above.  Assessment/ Plan: 67 y.o. male   1. Herpes labialis without complication We will obtain liver function testing since it has been since 2016 since last check.  For some reason acyclovir as prescribed twice daily.  He is not having 6 or more episodes per year, therefore he does not need preventative treatment.  Will prescribe acyclovir 400 mg 3 times daily for 5-10 days per episode.  If he starts having more than 6 episodes per year, will place on acyclovir 400 mg twice daily as prevention year round.  No evidence of secondary bacterial infection.  Patient to follow-up as needed. - CMP14+EGFR  2. Medication monitoring encounter - CMP14+EGFR  3. Olecranon bursitis of right elbow No significant swelling on today's exam.  However, the area of concern is consistent with an olecranon  bursitis.  Home care instructions reviewed with the patient, including compression, ice and oral NSAIDs.  Handout provided.  He will follow-up as needed.   Orders Placed This Encounter  Procedures  . CMP14+EGFR   Meds ordered this encounter  Medications  . acyclovir (ZOVIRAX) 400 MG tablet    Sig: Take 1 tablet (400 mg total) by mouth 3 (three) times daily. x5-10 days (per cold sore episode)    Dispense:  60 tablet    Refill:  0     Ashly Windell Moulding, DO [place] [phone]

## 2017-08-26 DIAGNOSIS — H5203 Hypermetropia, bilateral: Secondary | ICD-10-CM | POA: Diagnosis not present

## 2017-08-26 DIAGNOSIS — H52223 Regular astigmatism, bilateral: Secondary | ICD-10-CM | POA: Diagnosis not present

## 2017-08-26 DIAGNOSIS — H524 Presbyopia: Secondary | ICD-10-CM | POA: Diagnosis not present

## 2017-08-26 DIAGNOSIS — H25813 Combined forms of age-related cataract, bilateral: Secondary | ICD-10-CM | POA: Diagnosis not present

## 2017-08-27 ENCOUNTER — Ambulatory Visit (INDEPENDENT_AMBULATORY_CARE_PROVIDER_SITE_OTHER): Payer: Medicare Other | Admitting: Family Medicine

## 2017-08-27 ENCOUNTER — Encounter: Payer: Self-pay | Admitting: Family Medicine

## 2017-08-27 VITALS — BP 153/85 | HR 80 | Temp 97.6°F | Ht 70.0 in | Wt 181.0 lb

## 2017-08-27 DIAGNOSIS — F17210 Nicotine dependence, cigarettes, uncomplicated: Secondary | ICD-10-CM | POA: Diagnosis not present

## 2017-08-27 DIAGNOSIS — J301 Allergic rhinitis due to pollen: Secondary | ICD-10-CM

## 2017-08-27 DIAGNOSIS — F17200 Nicotine dependence, unspecified, uncomplicated: Secondary | ICD-10-CM | POA: Insufficient documentation

## 2017-08-27 MED ORDER — VARENICLINE TARTRATE 0.5 MG X 11 & 1 MG X 42 PO MISC: ORAL | 0 refills | Status: DC

## 2017-08-27 MED ORDER — FLUTICASONE PROPIONATE 50 MCG/ACT NA SUSP: 2.0000 | Freq: Every day | NASAL | 6 refills | Status: DC

## 2017-08-27 NOTE — Patient Instructions (Signed)
I recommend Claritin or Allegra if you are looking for a nondrowsy antihistamine.  You may take 1 pill daily. If you would like something to help you sleep that is an antihistamine, consider Xyzal or Zyrtec.  Generic versions of any of these medications are okay.  Just use 1 and take 1 pill daily.  I have also sent you an Flonase nasal spray.  We discussed that you will use 2 sprays in each nostril 1 time per day.  It may take good 7 to 10 days before you start noticing an improvement in your symptoms.  If you develop any other worrisome symptoms or signs that we discussed with regards to bacterial sinusitis, please call me and I will send an antibiotic.  We discussed smoking cessation today.  I have called in Chantix for you to start to help you stop smoking.  Congratulations on starting this endeavor and happy early birthday!  Follow-up with me as needed.   Sinusitis, Adult Sinusitis is soreness and inflammation of your sinuses. Sinuses are hollow spaces in the bones around your face. They are located:  Around your eyes.  In the middle of your forehead.  Behind your nose.  In your cheekbones.  Your sinuses and nasal passages are lined with a stringy fluid (mucus). Mucus normally drains out of your sinuses. When your nasal tissues get inflamed or swollen, the mucus can get trapped or blocked so air cannot flow through your sinuses. This lets bacteria, viruses, and funguses grow, and that leads to infection. Follow these instructions at home: Medicines  Take, use, or apply over-the-counter and prescription medicines only as told by your doctor. These may include nasal sprays.  If you were prescribed an antibiotic medicine, take it as told by your doctor. Do not stop taking the antibiotic even if you start to feel better. Hydrate and Humidify  Drink enough water to keep your pee (urine) clear or pale yellow.  Use a cool mist humidifier to keep the humidity level in your home above  50%.  Breathe in steam for 10-15 minutes, 3-4 times a day or as told by your doctor. You can do this in the bathroom while a hot shower is running.  Try not to spend time in cool or dry air. Rest  Rest as much as possible.  Sleep with your head raised (elevated).  Make sure to get enough sleep each night. General instructions  Put a warm, moist washcloth on your face 3-4 times a day or as told by your doctor. This will help with discomfort.  Wash your hands often with soap and water. If there is no soap and water, use hand sanitizer.  Do not smoke. Avoid being around people who are smoking (secondhand smoke).  Keep all follow-up visits as told by your doctor. This is important. Contact a doctor if:  You have a fever.  Your symptoms get worse.  Your symptoms do not get better within 10 days. Get help right away if:  You have a very bad headache.  You cannot stop throwing up (vomiting).  You have pain or swelling around your face or eyes.  You have trouble seeing.  You feel confused.  Your neck is stiff.  You have trouble breathing. This information is not intended to replace advice given to you by your health care provider. Make sure you discuss any questions you have with your health care provider. Document Released: 10/02/2007 Document Revised: 12/10/2015 Document Reviewed: 02/08/2015 Elsevier Interactive Patient Education  2018 Elsevier Inc.  

## 2017-08-27 NOTE — Progress Notes (Signed)
Subjective: CC: Sinusitis PCP: Janora Norlander, DO WCB:JSEG Christopher Herring is a 67 y.o. male presenting to clinic today for:  1. Sinusitis Patient reports a 1 week history of left-sided earache and sensation of "rawness in his sinuses".  He denies any facial or dental pain.  No headaches, fevers, nausea, vomiting, diarrhea.  He has been experiencing some sneezing and a mildly productive cough.  No shortness of breath or hemoptysis.  He has not used any therapies for treatments.  He is a current every day smoker.  2.  Tobacco use disorder Patient smokes about a half a pack per day and has done so for over 50 years.  He notes that he was able to quit for about 7 years using Chantix many years ago but started smoking again.  He is interested in smoking cessation is actually made a goal of 09/04/2017 as a quit date.  Reports mildly productive cough as above.  No shortness of breath.   ROS: Per HPI  No Known Allergies No past medical history on file.  Current Outpatient Medications:  .  acyclovir (ZOVIRAX) 400 MG tablet, Take 1 tablet (400 mg total) by mouth 3 (three) times daily. x5-10 days (per cold sore episode), Disp: 60 tablet, Rfl: 0 Social History   Socioeconomic History  . Marital status: Married    Spouse name: Not on file  . Number of children: Not on file  . Years of education: Not on file  . Highest education level: Not on file  Occupational History  . Not on file  Social Needs  . Financial resource strain: Not on file  . Food insecurity:    Worry: Not on file    Inability: Not on file  . Transportation needs:    Medical: Not on file    Non-medical: Not on file  Tobacco Use  . Smoking status: Current Every Day Smoker    Packs/day: 0.75    Years: 50.00    Pack years: 37.50    Types: Cigarettes  . Smokeless tobacco: Current User    Types: Snuff  Substance and Sexual Activity  . Alcohol use: Yes    Alcohol/week: 6.0 oz    Types: 10 Glasses of wine per week  . Drug  use: No  . Sexual activity: Not on file  Lifestyle  . Physical activity:    Days per week: Not on file    Minutes per session: Not on file  . Stress: Not on file  Relationships  . Social connections:    Talks on phone: Not on file    Gets together: Not on file    Attends religious service: Not on file    Active member of club or organization: Not on file    Attends meetings of clubs or organizations: Not on file    Relationship status: Not on file  . Intimate partner violence:    Fear of current or ex partner: Not on file    Emotionally abused: Not on file    Physically abused: Not on file    Forced sexual activity: Not on file  Other Topics Concern  . Not on file  Social History Narrative  . Not on file   Family History  Problem Relation Age of Onset  . Diabetes Mother   . Alzheimer's disease Mother   . Heart disease Father   . Cancer Sister     Objective: Office vital signs reviewed. BP (!) 153/85   Pulse 80  Temp 97.6 F (36.4 C) (Oral)   Ht 5\' 10"  (1.778 m)   Wt 181 lb (82.1 kg)   BMI 25.97 kg/m   Physical Examination:  General: Awake, alert, well nourished, No acute distress HEENT: Normal, no sinus TTP    Neck: No masses palpated. No lymphadenopathy    Ears: Tympanic membranes intact, normal light reflex, no erythema, no bulging    Eyes: PERRLA, extraocular membranes intact, sclera white    Nose: nasal turbinates moist, clear nasal discharge    Throat: moist mucus membranes, cobblestone appearance of the oropharynx per, no tonsillar exudate.  Airway is patent Cardio: regular rate and rhythm, S1S2 heard, no murmurs appreciated Pulm: clear to auscultation bilaterally, no wheezes, rhonchi or rales; normal work of breathing on room air  Assessment/ Plan: 67 y.o. male   1. Seasonal allergic rhinitis due to pollen No evidence of acute bacterial sinusitis on today's exam.  I suspect that this is a seasonal allergic rhinitis versus allergy mediated sinusitis.   He has not tried any over-the-counter treatments.  I did recommend obtaining an oral over-the-counter oral antihistamine.  I will add Flonase nasal spray.  Instructions for use discussed with the patient.  He voiced understanding.  We discussed signs and symptoms of bacterial sinusitis.  He will contact me if symptoms are worsening or not improving with above therapies.  2. Heavy tobacco smoker >10 cigarettes per day Patient is in the action phase of smoking cessation with a goal cessation date of 09/04/2017.  He is willing to start Chantix again and I have sent this over to his pharmacy.  I encouraged him to call me with any questions or concerns with regards to smoking cessation.   Meds ordered this encounter  Medications  . fluticasone (FLONASE) 50 MCG/ACT nasal spray    Sig: Place 2 sprays into both nostrils daily.    Dispense:  16 g    Refill:  6  . varenicline (CHANTIX STARTING MONTH PAK) 0.5 MG X 11 & 1 MG X 42 tablet    Sig: Take 0.5 mg tablet by mouth 1x daily x3 days, then increase to 0.5 mg tablet 2x daily x4 days, then increase to 1 mg tablet 2x daily.    Dispense:  53 tablet    Refill:  [place], DO [place] [phone]

## 2018-06-30 ENCOUNTER — Ambulatory Visit (INDEPENDENT_AMBULATORY_CARE_PROVIDER_SITE_OTHER): Payer: Medicare Other | Admitting: Family Medicine

## 2018-06-30 ENCOUNTER — Encounter: Payer: Self-pay | Admitting: Family Medicine

## 2018-06-30 VITALS — BP 148/87 | HR 64 | Temp 97.3°F | Ht 70.0 in | Wt 186.8 lb

## 2018-06-30 DIAGNOSIS — R079 Chest pain, unspecified: Secondary | ICD-10-CM

## 2018-06-30 DIAGNOSIS — I4510 Unspecified right bundle-branch block: Secondary | ICD-10-CM | POA: Diagnosis not present

## 2018-06-30 MED ORDER — ACYCLOVIR 400 MG PO TABS: 400.0000 mg | ORAL_TABLET | Freq: Three times a day (TID) | ORAL | 0 refills | Status: DC

## 2018-06-30 NOTE — Progress Notes (Signed)
BP (!) 148/87   Pulse 64   Temp (!) 97.3 F (36.3 C) (Oral)   Ht 5\' 10"  (1.778 m)   Wt 186 lb 12.8 oz (84.7 kg)   BMI 26.80 kg/m    Subjective:    Patient ID: Christopher Herring, male    DOB: Aug 12, 1950, 68 y.o.   MRN: [id number]  HPI: Christopher Herring is a 68 y.o. male presenting on 06/30/2018 for Dizziness (Saturday morning - has not happened again ) and Panic Attack   HPI Pt is a 68 year old male presenting after a 3-4 hour episode of chest pain and weakness 3 days ago. Pt states he woke up on Saturday and started feeling jittery, weak, and light-headed. He complained of lower, central chest pain and epigastric pain, which he thought may have been indigestion. Pt states he felt heaviness and weakness in his arms, and felt fatigued walking to the mailbox. Pt had his blood pressure taken at the time and states it was about 130/78. He took antacid and aspirin, and the pain eventually subsided. He states he felt anxious about his pain because his brother-in-law recently had a heart-attack.  Pt has had no further episodes since this time. He denies radiating pain to his arms or jaw. He denies shortness of breath, palpitations, nausea, and vomiting.  Relevant past medical, surgical, family and social history reviewed and updated as indicated. Interim medical history since our last visit reviewed. Allergies and medications reviewed and updated.  Review of Systems  Constitutional: Positive for fatigue. Negative for chills and fever.  HENT: Negative for congestion and trouble swallowing.   Respiratory: Negative for chest tightness and shortness of breath.   Cardiovascular: Positive for chest pain. Negative for palpitations.  Gastrointestinal: Negative for abdominal pain, blood in stool, diarrhea, nausea and vomiting.  Neurological: Positive for weakness and light-headedness. Negative for dizziness, numbness and headaches.    Per HPI unless specifically indicated above   Allergies as of 06/30/2018     No Known Allergies     Medication List       Accurate as of June 30, 2018 11:59 PM. Always use your most recent med list.        acyclovir 400 MG tablet Commonly known as:  ZOVIRAX Take 1 tablet (400 mg total) by mouth 3 (three) times daily. x5-10 days (per cold sore episode)   fluticasone 50 MCG/ACT nasal spray Commonly known as:  FLONASE Place 2 sprays into both nostrils daily.          Objective:    BP (!) 148/87   Pulse 64   Temp (!) 97.3 F (36.3 C) (Oral)   Ht 5\' 10"  (1.778 m)   Wt 186 lb 12.8 oz (84.7 kg)   BMI 26.80 kg/m   Wt Readings from Last 3 Encounters:  06/30/18 186 lb 12.8 oz (84.7 kg)  08/27/17 181 lb (82.1 kg)  06/09/17 181 lb (82.1 kg)    Physical Exam Constitutional:      General: He is not in acute distress.    Appearance: Normal appearance.  HENT:     Mouth/Throat:     Pharynx: Oropharynx is clear. No posterior oropharyngeal erythema.  Eyes:     Extraocular Movements: Extraocular movements intact.     Pupils: Pupils are equal, round, and reactive to light.  Cardiovascular:     Rate and Rhythm: Normal rate and regular rhythm.     Heart sounds: Normal heart sounds. No murmur.  Pulmonary:  Effort: Pulmonary effort is normal. No respiratory distress.     Breath sounds: Normal breath sounds. No wheezing.  Musculoskeletal:     Right lower leg: No edema.     Left lower leg: No edema.  Neurological:     Mental Status: He is alert and oriented to person, place, and time.     Deep Tendon Reflexes: Reflexes normal.  Psychiatric:        Behavior: Behavior normal.       Assessment & Plan:   Problem List Items Addressed This Visit    None    Visit Diagnoses    Chest pain, unspecified type    -  Primary   Relevant Orders   EKG 12-Lead (Completed)   Ambulatory referral to Cardiology   Right bundle branch block       Relevant Orders   Ambulatory referral to Cardiology     Chest Pain and new Right Bundle Branch Block - Will refer  to cardiology for further evaluation - Recommended that if the pt experiences chest pain again, he should go to the emergency department   Follow up plan: Return if symptoms worsen or fail to improve.  Counseling provided for all of the vaccine components Orders Placed This Encounter  Procedures  . Ambulatory referral to Cardiology  . EKG 12-Lead    [place]  I was personally present for all components of the history, physical exam and/or medical decision making.  I agree with the documentation performed by the PA student and agree with assessment and plan above.  PA student was Johnney Killian. Caryl Pina, MD [facility] 07/07/2018, 11:24 AM

## 2018-07-06 ENCOUNTER — Telehealth: Payer: Self-pay

## 2018-07-06 NOTE — Telephone Encounter (Signed)
Yes that should be fine, if you experience any further chest pain come in or go into the emergency department.

## 2018-07-06 NOTE — Telephone Encounter (Signed)
Cardiologist can't get me in till 07/21/2018  Is that too long to wait?

## 2018-07-06 NOTE — Telephone Encounter (Signed)
Aware. 

## 2018-07-21 ENCOUNTER — Ambulatory Visit: Payer: Self-pay | Admitting: Cardiology

## 2018-08-17 ENCOUNTER — Ambulatory Visit: Payer: Self-pay | Admitting: Cardiovascular Disease

## 2018-11-03 DIAGNOSIS — H60543 Acute eczematoid otitis externa, bilateral: Secondary | ICD-10-CM | POA: Diagnosis not present

## 2018-12-29 ENCOUNTER — Ambulatory Visit: Payer: Self-pay | Admitting: Cardiovascular Disease

## 2019-03-24 ENCOUNTER — Other Ambulatory Visit: Payer: Self-pay

## 2019-06-10 ENCOUNTER — Other Ambulatory Visit: Payer: Self-pay

## 2019-06-11 ENCOUNTER — Ambulatory Visit (INDEPENDENT_AMBULATORY_CARE_PROVIDER_SITE_OTHER): Payer: Medicare Other | Admitting: Family Medicine

## 2019-06-11 ENCOUNTER — Encounter: Payer: Self-pay | Admitting: Family Medicine

## 2019-06-11 VITALS — BP 173/86 | HR 67 | Temp 98.0°F | Ht 70.0 in | Wt 191.0 lb

## 2019-06-11 DIAGNOSIS — Z1159 Encounter for screening for other viral diseases: Secondary | ICD-10-CM

## 2019-06-11 DIAGNOSIS — R5383 Other fatigue: Secondary | ICD-10-CM | POA: Diagnosis not present

## 2019-06-11 DIAGNOSIS — Z131 Encounter for screening for diabetes mellitus: Secondary | ICD-10-CM | POA: Diagnosis not present

## 2019-06-11 DIAGNOSIS — N400 Enlarged prostate without lower urinary tract symptoms: Secondary | ICD-10-CM

## 2019-06-11 DIAGNOSIS — Z136 Encounter for screening for cardiovascular disorders: Secondary | ICD-10-CM | POA: Diagnosis not present

## 2019-06-11 DIAGNOSIS — Z13220 Encounter for screening for lipoid disorders: Secondary | ICD-10-CM | POA: Diagnosis not present

## 2019-06-11 DIAGNOSIS — R6889 Other general symptoms and signs: Secondary | ICD-10-CM | POA: Diagnosis not present

## 2019-06-11 MED ORDER — ACYCLOVIR 400 MG PO TABS: 400.0000 mg | ORAL_TABLET | Freq: Three times a day (TID) | ORAL | 3 refills | Status: DC

## 2019-06-11 NOTE — Progress Notes (Signed)
BP (!) 173/86   Pulse 67   Temp 98 F (36.7 C) (Temporal)   Ht _0  (1.778 m)   Wt 191 lb (86.6 kg)   SpO2 98%   BMI 27.41 kg/m    Subjective:   Patient ID: Christopher Herring, male    DOB: Jul 14, 1950, 69 y.o.   MRN: [id number]  HPI: Christopher Herring is a 69 y.o. male presenting on 06/11/2019 for Medical Management of Chronic Issues and Fatigue (Patient states it has been ongoing but has gotten worse)   HPI Patient comes in for physical and checkup.  He says has been having increasing fatigue especially his lower extremities, he says he cannot walk as far as he used to before getting tired.  He does admit that this past year he has been home and off work and retired and has been sitting around a lot more.  Patient denies any pain in his legs just says when he walks distances he will get tired quicker.  He says it feels sometimes like he ran a race and he is tired like that.  He did have an exam looking at the blood flow in his legs and they said that was normal.  He does have a history of back issues.  Patient gets acyclovir for cold sores when he has an episode, he will take about 5 days worth.  Relevant past medical, surgical, family and social history reviewed and updated as indicated. Interim medical history since our last visit reviewed. Allergies and medications reviewed and updated.  Review of Systems  Constitutional: Positive for fatigue. Negative for chills and fever.  Eyes: Negative for visual disturbance.  Respiratory: Negative for shortness of breath and wheezing.   Cardiovascular: Negative for chest pain and leg swelling.  Musculoskeletal: Negative for back pain and gait problem.  Skin: Negative for rash.  Neurological: Positive for weakness. Negative for dizziness and light-headedness.  All other systems reviewed and are negative.   Per HPI unless specifically indicated above   Allergies as of 06/11/2019   No Known Allergies     Medication List       Accurate as of  June 11, 2019  1:28 PM. If you have any questions, ask your nurse or doctor.        STOP taking these medications   fluticasone 50 MCG/ACT nasal spray Commonly known as: FLONASE Stopped by: Fransisca Kaufmann Ersilia Brawley, MD     TAKE these medications   acyclovir 400 MG tablet Commonly known as: ZOVIRAX Take 1 tablet (400 mg total) by mouth 3 (three) times daily. x5-10 days (per cold sore episode)        Objective:   BP (!) 173/86   Pulse 67   Temp 98 F (36.7 C) (Temporal)   Ht _1  (1.778 m)   Wt 191 lb (86.6 kg)   SpO2 98%   BMI 27.41 kg/m   Wt Readings from Last 3 Encounters:  06/11/19 191 lb (86.6 kg)  06/30/18 186 lb 12.8 oz (84.7 kg)  08/27/17 181 lb (82.1 kg)    Physical Exam Vitals and nursing note reviewed.  Constitutional:      General: He is not in acute distress.    Appearance: He is well-developed. He is not diaphoretic.  Eyes:     General: No scleral icterus.    Conjunctiva/sclera: Conjunctivae normal.  Neck:     Thyroid: No thyromegaly.  Cardiovascular:     Rate and Rhythm: Normal rate and regular  rhythm.     Heart sounds: Normal heart sounds. No murmur.  Pulmonary:     Effort: Pulmonary effort is normal. No respiratory distress.     Breath sounds: Normal breath sounds. No wheezing.  Abdominal:     General: Abdomen is flat. Bowel sounds are normal. There is no distension.     Tenderness: There is no abdominal tenderness. There is no right CVA tenderness, left CVA tenderness, guarding or rebound.  Musculoskeletal:        General: No tenderness (Negative straight leg raise). Normal range of motion.     Cervical back: Neck supple.  Lymphadenopathy:     Cervical: No cervical adenopathy.  Skin:    General: Skin is warm and dry.     Findings: No rash.  Neurological:     Mental Status: He is alert and oriented to person, place, and time.     Coordination: Coordination normal.  Psychiatric:        Behavior: Behavior normal.       Assessment &  Plan:   Problem List Items Addressed This Visit      Other   Enlarged prostate - Primary   Relevant Orders   CBC with Differential/Platelet   PSA, total and free   TSH   Vitamin B12    Other Visit Diagnoses    Lipid screening       Relevant Orders   Lipid panel   Diabetes mellitus screening       Relevant Orders   CBC with Differential/Platelet   CMP14+EGFR   TSH   Vitamin B12   Fatigue, unspecified type       Relevant Orders   TSH   Vitamin B12      Continue Cyclovir as needed, will check blood work, like she has the fatigue is due to deconditioning versus neurogenic claudication, he will see if he can increase his exercise to help with that and then let us know from there Follow up plan: Return if symptoms worsen or fail to improve.  Counseling provided for all of the vaccine components Orders Placed This Encounter  Procedures  . CBC with Differential/Platelet  . CMP14+EGFR  . Lipid panel  . PSA, total and free  . TSH  . Vitamin B12    Caryl Pina, MD [facility] 06/11/2019, 1:28 PM

## 2019-06-11 NOTE — Addendum Note (Signed)
Addended by: Caryl Pina on: 06/11/2019 01:29 PM   Modules accepted: Orders

## 2019-06-12 LAB — CBC WITH DIFFERENTIAL/PLATELET
Basophils Absolute: 0 10*3/uL (ref 0.0–0.2)
Basos: 0 %
EOS (ABSOLUTE): 0.2 10*3/uL (ref 0.0–0.4)
Eos: 2 %
Hematocrit: 48.3 % (ref 37.5–51.0)
Hemoglobin: 16.5 g/dL (ref 13.0–17.7)
Immature Grans (Abs): 0.1 10*3/uL (ref 0.0–0.1)
Immature Granulocytes: 1 %
Lymphocytes Absolute: 2.1 10*3/uL (ref 0.7–3.1)
Lymphs: 27 %
MCH: 31.8 pg (ref 26.6–33.0)
MCHC: 34.2 g/dL (ref 31.5–35.7)
MCV: 93 fL (ref 79–97)
Monocytes Absolute: 0.9 10*3/uL (ref 0.1–0.9)
Monocytes: 11 %
Neutrophils Absolute: 4.7 10*3/uL (ref 1.4–7.0)
Neutrophils: 59 %
Platelets: 289 10*3/uL (ref 150–450)
RBC: 5.19 x10E6/uL (ref 4.14–5.80)
RDW: 12.9 % (ref 11.6–15.4)
WBC: 8 10*3/uL (ref 3.4–10.8)

## 2019-06-12 LAB — LIPID PANEL
Chol/HDL Ratio: 4.5 ratio (ref 0.0–5.0)
Cholesterol, Total: 230 mg/dL — ABNORMAL HIGH (ref 100–199)
HDL: 51 mg/dL (ref >39)
LDL Chol Calc (NIH): 148 mg/dL — ABNORMAL HIGH (ref 0–99)
Triglycerides: 170 mg/dL — ABNORMAL HIGH (ref 0–149)
VLDL Cholesterol Cal: 31 mg/dL (ref 5–40)

## 2019-06-12 LAB — HEPATITIS C ANTIBODY: Hep C Virus Ab: <0.1 s/co ratio (ref 0.0–0.9)

## 2019-06-12 LAB — CMP14+EGFR
ALT: 17 IU/L (ref 0–44)
AST: 17 IU/L (ref 0–40)
Albumin/Globulin Ratio: 1.9 (ref 1.2–2.2)
Albumin: 4.4 g/dL (ref 3.8–4.8)
Alkaline Phosphatase: 58 IU/L (ref 39–117)
BUN/Creatinine Ratio: 10 (ref 10–24)
BUN: 7 mg/dL — ABNORMAL LOW (ref 8–27)
Bilirubin Total: 0.3 mg/dL (ref 0.0–1.2)
CO2: 23 mmol/L (ref 20–29)
Calcium: 9.2 mg/dL (ref 8.6–10.2)
Chloride: 103 mmol/L (ref 96–106)
Creatinine, Ser: 0.69 mg/dL — ABNORMAL LOW (ref 0.76–1.27)
GFR calc Af Amer: 112 mL/min/{1.73_m2} (ref >59)
GFR calc non Af Amer: 97 mL/min/{1.73_m2} (ref >59)
Globulin, Total: 2.3 g/dL (ref 1.5–4.5)
Glucose: 88 mg/dL (ref 65–99)
Potassium: 4.3 mmol/L (ref 3.5–5.2)
Sodium: 139 mmol/L (ref 134–144)
Total Protein: 6.7 g/dL (ref 6.0–8.5)

## 2019-06-12 LAB — PSA, TOTAL AND FREE
PSA, Free Pct: 31.4 %
PSA, Free: 0.22 ng/mL
Prostate Specific Ag, Serum: 0.7 ng/mL (ref 0.0–4.0)

## 2019-06-12 LAB — TSH: TSH: 2.13 u[IU]/mL (ref 0.450–4.500)

## 2019-06-12 LAB — VITAMIN B12: Vitamin B-12: 253 pg/mL (ref 232–1245)

## 2019-06-14 ENCOUNTER — Other Ambulatory Visit: Payer: Self-pay

## 2019-06-14 MED ORDER — ATORVASTATIN CALCIUM 20 MG PO TABS: 20.0000 mg | ORAL_TABLET | Freq: Every day | ORAL | 3 refills | Status: DC

## 2019-07-01 ENCOUNTER — Ambulatory Visit: Payer: Medicare Other

## 2019-07-04 DIAGNOSIS — Z23 Encounter for immunization: Secondary | ICD-10-CM | POA: Diagnosis not present

## 2019-07-15 ENCOUNTER — Ambulatory Visit: Payer: Medicare Other

## 2019-07-25 DIAGNOSIS — Z23 Encounter for immunization: Secondary | ICD-10-CM | POA: Diagnosis not present

## 2019-08-27 DIAGNOSIS — H9202 Otalgia, left ear: Secondary | ICD-10-CM | POA: Diagnosis not present

## 2019-08-27 DIAGNOSIS — T162XXA Foreign body in left ear, initial encounter: Secondary | ICD-10-CM | POA: Diagnosis not present

## 2019-11-15 DIAGNOSIS — H60543 Acute eczematoid otitis externa, bilateral: Secondary | ICD-10-CM | POA: Diagnosis not present

## 2021-02-06 ENCOUNTER — Encounter: Payer: Self-pay | Admitting: Gastroenterology

## 2021-04-25 ENCOUNTER — Ambulatory Visit (INDEPENDENT_AMBULATORY_CARE_PROVIDER_SITE_OTHER): Payer: Medicare Other | Admitting: *Deleted

## 2021-04-25 DIAGNOSIS — Z0000 Encounter for general adult medical examination without abnormal findings: Secondary | ICD-10-CM

## 2021-04-25 NOTE — Progress Notes (Signed)
MEDICARE ANNUAL WELLNESS VISIT  04/25/2021  Telephone Visit Disclaimer This Medicare AWV was conducted by telephone due to national recommendations for restrictions regarding the COVID-19 Pandemic (e.g. social distancing).  I verified, using two identifiers, that I am speaking with Christopher Herring or their authorized healthcare agent. I discussed the limitations, risks, security, and privacy concerns of performing an evaluation and management service by telephone and the potential availability of an in-person appointment in the future. The patient expressed understanding and agreed to proceed.  Location of Patient: home Location of Provider (nurse):  office  Subjective:    Christopher Herring is a 70 y.o. male patient of Dettinger, Fransisca Kaufmann, MD who had a Medicare Annual Wellness Visit today via telephone. Christopher Herring is Retired and lives with an adult companion. he has 3 children. he reports that he is socially active and does interact with friends/family regularly. he is minimally physically active and enjoys hunting and fishing.  Patient Care Team: Dettinger, Fransisca Kaufmann, MD as PCP - General (Family Medicine)  Advanced Directives 04/25/2021  Does Patient Have a Medical Advance Directive? No  Would patient like information on creating a medical advance directive? No - Patient declined    Hospital Utilization Over the Past 12 Months: # of hospitalizations or ER visits: 0 # of surgeries: 0  Review of Systems    Patient reports that his overall health is unchanged compared to last year.  History obtained from chart review and the patient  Patient Reported Readings (BP, Pulse, CBG, Weight, etc) none  Pain Assessment Pain : No/denies pain     Current Medications & Allergies (verified) Allergies as of 04/25/2021   No Known Allergies      Medication List        Accurate as of April 25, 2021 10:17 AM. If you have any questions, ask your nurse or doctor.          acyclovir 400  MG tablet Commonly known as: ZOVIRAX Take 1 tablet (400 mg total) by mouth 3 (three) times daily. x5-10 days (per cold sore episode)   atorvastatin 20 MG tablet Commonly known as: LIPITOR Take 1 tablet (20 mg total) by mouth daily.        History (reviewed): History reviewed. No pertinent past medical history. Past Surgical History:  Procedure Laterality Date   APPENDECTOMY  1976   BACK SURGERY  1996   L4-5   Family History  Problem Relation Age of Onset   Diabetes Mother    Alzheimer's disease Mother    Heart disease Father    Cancer Sister    Social History   Socioeconomic History   Marital status: Divorced    Spouse name: Not on file   Number of children: 3   Years of education: 14   Highest education level: Not on file  Occupational History   Not on file  Tobacco Use   Smoking status: Former    Packs/day: 0.75    Years: 50.00    Pack years: 37.50    Types: Cigarettes    Quit date: 2020    Years since quitting: 2.9   Smokeless tobacco: Former    Types: Snuff  Vaping Use   Vaping Use: Former  Substance and Sexual Activity   Alcohol use: Yes    Alcohol/week: 10.0 standard drinks    Types: 10 Glasses of wine per week   Drug use: No   Sexual activity: Not on file  Other Topics Concern  Not on file  Social History Narrative   Not on file   Social Determinants of Health   Financial Resource Strain: Not on file  Food Insecurity: Not on file  Transportation Needs: No Transportation Needs   Lack of Transportation (Medical): No   Lack of Transportation (Non-Medical): No  Physical Activity: Inactive   Days of Exercise per Week: 0 days   Minutes of Exercise per Session: 0 min  Stress: Not on file  Social Connections: Not on file    Activities of Daily Living In your present state of health, do you have any difficulty performing the following activities: 04/25/2021  Hearing? Y  Comment has hearing aids, but doesn't wear  Vision? Y  Comment  contacts  Difficulty concentrating or making decisions? N  Walking or climbing stairs? N  Dressing or bathing? N  Doing errands, shopping? N  Preparing Food and eating ? N  Using the Toilet? N  In the past six months, have you accidently leaked urine? N  Do you have problems with loss of bowel control? N  Managing your Medications? N  Managing your Finances? N  Housekeeping or managing your Housekeeping? N  Some recent data might be hidden    Patient Education/ Literacy What is the last grade level you completed in school?: 14  Exercise Current Exercise Habits: The patient does not participate in regular exercise at present, Exercise limited by: None identified  Diet Patient reports consuming 2 meals a day and 2 snack(s) a day Patient reports that his primary diet is: Regular Patient reports that she does have regular access to food.   Depression Screen PHQ 2/9 Scores 04/25/2021 06/11/2019 06/30/2018 06/09/2017 04/15/2016 03/25/2016 01/06/2016  PHQ - 2 Score 0 0 4 0 0 0 0  PHQ- 9 Score - - 6 - - - -     Fall Risk Fall Risk  04/25/2021 06/11/2019 03/24/2019 06/09/2017 04/03/2017  Falls in the past year? 0 0 0 No No  Comment - - Emmi Telephone Survey: data to providers prior to load - Emmi Telephone Survey: data to providers prior to load     Objective:  Christopher Herring seemed alert and oriented and he participated appropriately during our telephone visit.  Blood Pressure Weight BMI  BP Readings from Last 3 Encounters:  06/11/19 (!) 173/86  06/30/18 (!) 148/87  08/27/17 (!) 153/85   Wt Readings from Last 3 Encounters:  06/11/19 191 lb (86.6 kg)  06/30/18 186 lb 12.8 oz (84.7 kg)  08/27/17 181 lb (82.1 kg)   BMI Readings from Last 1 Encounters:  06/11/19 27.41 kg/m    *Unable to obtain current vital signs, weight, and BMI due to telephone visit type  Hearing/Vision  Christopher Herring did not seem to have difficulty with hearing/understanding during the telephone conversation Reports  that he has had a formal eye exam by an eye care professional within the past year Reports that he has had a formal hearing evaluation within the past year *Unable to fully assess hearing and vision during telephone visit type  Cognitive Function: 6CIT Screen 04/25/2021  What Year? 0 points  What month? 0 points  What time? 0 points  Count back from 20 0 points  Months in reverse 0 points  Repeat phrase 0 points  Total Score 0   (Normal:0-7, Significant for Dysfunction: >8)  Normal Cognitive Function Screening: Yes   Immunization & Health Maintenance Record  There is no immunization history on file for this patient.  Health Maintenance  Topic Date Due   COVID-19 Vaccine (1) Never done   Pneumonia Vaccine 65+ Years old (1 - PCV) Never done   TETANUS/TDAP  Never done   Zoster Vaccines- Shingrix (1 of 2) Never done   INFLUENZA VACCINE  Never done   COLONOSCOPY (Pts 45-33yrs Insurance coverage will need to be confirmed)  03/17/2021   Hepatitis C Screening  Completed   HPV VACCINES  Aged Out       Assessment  This is a routine wellness examination for Christopher Herring.  Health Maintenance: Due or Overdue Health Maintenance Due  Topic Date Due   COVID-19 Vaccine (1) Never done   Pneumonia Vaccine 50+ Years old (1 - PCV) Never done   TETANUS/TDAP  Never done   Zoster Vaccines- Shingrix (1 of 2) Never done   INFLUENZA VACCINE  Never done   COLONOSCOPY (Pts 45-95yrs Insurance coverage will need to be confirmed)  03/17/2021    Christopher Herring does not need a referral for Community Assistance: Care Management:   no Social Work:    no Prescription Assistance:  no Nutrition/Diabetes Education:  no   Plan:  Personalized Goals  Goals Addressed             This Visit's Progress    DIET - EAT MORE FRUITS AND VEGETABLES       DIET - REDUCE CALORIE INTAKE         Personalized Health Maintenance & Screening Recommendations  Pneumococcal vaccine  Influenza vaccine Td  vaccine Colorectal cancer screening Advanced directives: has NO advanced directive - not interested in additional information Pt refuses all vaccines except shingles.  Lung Cancer Screening Recommended: yes (Low Dose CT Chest recommended if Age 26-80 years, 30 pack-year currently smoking OR have quit w/in past 15 years) Hepatitis C Screening recommended: no HIV Screening recommended: no  Advanced Directives: Written information was not prepared per patient's request.  Referrals & Orders No orders of the defined types were placed in this encounter.   Follow-up Plan Follow-up with Dettinger, Fransisca Kaufmann, MD as planned. Scheduled appt for 05/18/21 for shingrix and referral to Dermatologist. Pt refuses all other vaccines at this time. Pt should have a chest CT, he quit smoking 2 years ago, but smoke for over 35 years. Pt does not have advanced directives and declined information. Pt does have hearing difficulties and has had several hearing test. He has 2 sets of hearing aids, but doesn't wear. Wears contacts and has yearly eye exams. Pt is independent with all ADL's. Pt does not want a copy of his AVS.    I have personally reviewed and noted the following in the patients chart:   Medical and social history Use of alcohol, tobacco or illicit drugs  Current medications and supplements Functional ability and status Nutritional status Physical activity Advanced directives List of other physicians Hospitalizations, surgeries, and ER visits in previous 12 months Vitals Screenings to include cognitive, depression, and falls Referrals and appointments  In addition, I have reviewed and discussed with Christopher Herring certain preventive protocols, quality metrics, and best practice recommendations. A written personalized care plan for preventive services as well as general preventive health recommendations is available and can be mailed to the patient at his request.      Rana Snare, LPN  [phone]

## 2021-04-30 ENCOUNTER — Emergency Department (HOSPITAL_COMMUNITY)
Admission: EM | Admit: 2021-04-30 | Discharge: 2021-04-30 | Disposition: A | Discharge status: Home / Self Care | Payer: Medicare Other | Attending: Emergency Medicine | Admitting: Emergency Medicine

## 2021-04-30 ENCOUNTER — Encounter (HOSPITAL_COMMUNITY): Payer: Self-pay | Admitting: *Deleted

## 2021-04-30 DIAGNOSIS — K1120 Sialoadenitis, unspecified: Secondary | ICD-10-CM | POA: Diagnosis not present

## 2021-04-30 DIAGNOSIS — R220 Localized swelling, mass and lump, head: Secondary | ICD-10-CM | POA: Diagnosis present

## 2021-04-30 DIAGNOSIS — D110 Benign neoplasm of parotid gland: Secondary | ICD-10-CM | POA: Diagnosis not present

## 2021-04-30 MED ORDER — AMOXICILLIN-POT CLAVULANATE 875-125 MG PO TABS: 1.0000 | ORAL_TABLET | Freq: Two times a day (BID) | ORAL | 0 refills | Status: DC

## 2021-04-30 NOTE — ED Triage Notes (Signed)
Right side facial swelling onset today, denies pain

## 2021-04-30 NOTE — ED Provider Notes (Signed)
[facility] EMERGENCY DEPARTMENT Provider Note  CSN: [id number] Arrival date & time: 04/30/21 1711  History Chief Complaint  Patient presents with   Facial Swelling    Christopher Herring is a 71 y.o. male with no significant PMH reports onset of painless R facial swelling starting around 11am and worse when he tried to eat around 5pm. He denies any dental pain or fever. No other systemic symptoms.    Home Medications Prior to Admission medications   Medication Sig Start Date End Date Taking? Authorizing Provider  amoxicillin-clavulanate (AUGMENTIN) 875-125 MG tablet Take 1 tablet by mouth every 12 (twelve) hours. 04/30/21  Yes Truddie Hidden, MD  acyclovir (ZOVIRAX) 400 MG tablet Take 1 tablet (400 mg total) by mouth 3 (three) times daily. x5-10 days (per cold sore episode) Patient not taking: Reported on 04/25/2021 06/11/19   Dettinger, Fransisca Kaufmann, MD  atorvastatin (LIPITOR) 20 MG tablet Take 1 tablet (20 mg total) by mouth daily. Patient not taking: Reported on 04/25/2021 06/14/19   Dettinger, Fransisca Kaufmann, MD     Allergies    Patient has no known allergies.   Review of Systems   Review of Systems Please see HPI for pertinent positives and negatives  Physical Exam BP (!) 201/64    Pulse 66    Temp 97.7 F (36.5 C) (Oral)    Resp 18    SpO2 97%   Physical Exam Vitals and nursing note reviewed.  Constitutional:      Appearance: Normal appearance.  HENT:     Head: Normocephalic and atraumatic.     Comments: Mild-moderate swelling of R parotid gland, no tenderness    Nose: Nose normal.     Mouth/Throat:     Mouth: Mucous membranes are moist.  Eyes:     Extraocular Movements: Extraocular movements intact.     Conjunctiva/sclera: Conjunctivae normal.  Cardiovascular:     Rate and Rhythm: Normal rate.  Pulmonary:     Effort: Pulmonary effort is normal.     Breath sounds: Normal breath sounds. No stridor.  Abdominal:     General: Abdomen is flat.     Palpations: Abdomen is  soft.     Tenderness: There is no abdominal tenderness.  Musculoskeletal:        General: No swelling. Normal range of motion.     Cervical back: Neck supple.  Skin:    General: Skin is warm and dry.  Neurological:     General: No focal deficit present.     Mental Status: He is alert.  Psychiatric:        Mood and Affect: Mood normal.    ED Results / Procedures / Treatments   EKG None  Procedures Procedures  Medications Ordered in the ED Medications - No data to display  Initial Impression and Plan  Patient here with R facial swelling, likely parotitis. He has a prior relationship with Dr. Redmond Baseman, ENT. Our scanner is not working Midwife, do not feel this warrants transfer for emergent CT, will begin Augmentin (he took one of his wife's around 4pm today) and close follow up with ENT for further management.   ED Course       MDM Rules/Calculators/A&P Medical Decision Making Problems Addressed: Parotiditis: acute illness or injury  Risk Prescription drug management.    Final Clinical Impression(s) / ED Diagnoses Final diagnoses:  Parotiditis    Rx / DC Orders ED Discharge Orders          Ordered  amoxicillin-clavulanate (AUGMENTIN) 875-125 MG tablet  Every 12 hours        04/30/21 2001             Truddie Hidden, MD 04/30/21 2002

## 2021-05-18 ENCOUNTER — Ambulatory Visit (INDEPENDENT_AMBULATORY_CARE_PROVIDER_SITE_OTHER): Payer: Medicare Other | Admitting: Family Medicine

## 2021-05-18 ENCOUNTER — Encounter: Payer: Self-pay | Admitting: Family Medicine

## 2021-05-18 VITALS — BP 146/78 | HR 74 | Ht 70.0 in | Wt 184.0 lb

## 2021-05-18 DIAGNOSIS — B001 Herpesviral vesicular dermatitis: Secondary | ICD-10-CM

## 2021-05-18 DIAGNOSIS — D229 Melanocytic nevi, unspecified: Secondary | ICD-10-CM | POA: Diagnosis not present

## 2021-05-18 DIAGNOSIS — R030 Elevated blood-pressure reading, without diagnosis of hypertension: Secondary | ICD-10-CM

## 2021-05-18 DIAGNOSIS — I10 Essential (primary) hypertension: Secondary | ICD-10-CM

## 2021-05-18 DIAGNOSIS — Z23 Encounter for immunization: Secondary | ICD-10-CM

## 2021-05-18 MED ORDER — ACYCLOVIR 400 MG PO TABS: 400.0000 mg | ORAL_TABLET | Freq: Three times a day (TID) | ORAL | 3 refills | Status: DC

## 2021-05-18 MED ORDER — AMLODIPINE BESYLATE 5 MG PO TABS: 5.0000 mg | ORAL_TABLET | Freq: Every day | ORAL | 1 refills | Status: DC

## 2021-05-18 NOTE — Progress Notes (Signed)
BP (!) 146/78    Pulse 74    Ht 5\' 10"  (1.778 m)    Wt 184 lb (83.5 kg)    SpO2 97%    BMI 26.40 kg/m    Subjective:   Patient ID: Christopher Herring, male    DOB: 05-31-1950, 71 y.o.   MRN: [id number]  HPI: Christopher Herring is a 71 y.o. male presenting on 05/18/2021 for place on face (Right cheek, present for months. ? Derm referral)   HPI Patient has a new nonpigmented lesion on his face that he thinks is a mole and thinks just came up over the past 6 months.  He has seen a dermatologist before but needs a referral because he has not seen them in some time.  Patient uses acyclovir occasionally for herpes labialis of the mouth just when he gets flareups of them./Cold sores.  Patient has elevated blood pressure and it has been slightly elevated over the past times.  Relevant past medical, surgical, family and social history reviewed and updated as indicated. Interim medical history since our last visit reviewed. Allergies and medications reviewed and updated.  Review of Systems  Constitutional:  Negative for chills and fever.  Respiratory:  Negative for shortness of breath and wheezing.   Cardiovascular:  Negative for chest pain and leg swelling.  Musculoskeletal:  Negative for back pain and gait problem.  Skin:  Negative for color change, rash and wound.  All other systems reviewed and are negative.  Per HPI unless specifically indicated above   Allergies as of 05/18/2021   No Known Allergies      Medication List        Accurate as of May 18, 2021  9:40 AM. If you have any questions, ask your nurse or doctor.          STOP taking these medications    amoxicillin-clavulanate 875-125 MG tablet Commonly known as: AUGMENTIN Stopped by: Fransisca Kaufmann Jiayi Lengacher, MD   atorvastatin 20 MG tablet Commonly known as: LIPITOR Stopped by: Fransisca Kaufmann Flor Whitacre, MD       TAKE these medications    acyclovir 400 MG tablet Commonly known as: ZOVIRAX Take 1 tablet (400 mg total) by  mouth 3 (three) times daily. x5-10 days (per cold sore episode)   amLODipine 5 MG tablet Commonly known as: NORVASC Take 1 tablet (5 mg total) by mouth daily. Started by: Fransisca Kaufmann [place], MD         Objective:   BP (!) 146/78    Pulse 74    Ht 5\' 10"  (1.778 m)    Wt 184 lb (83.5 kg)    SpO2 97%    BMI 26.40 kg/m   Wt Readings from Last 3 Encounters:  05/18/21 184 lb (83.5 kg)  06/11/19 191 lb (86.6 kg)  06/30/18 186 lb 12.8 oz (84.7 kg)    Physical Exam Vitals and nursing note reviewed.  Constitutional:      General: He is not in acute distress.    Appearance: He is well-developed. He is not diaphoretic.  Eyes:     General: No scleral icterus.    Conjunctiva/sclera: Conjunctivae normal.  Neck:     Thyroid: No thyromegaly.  Skin:    General: Skin is warm and dry.     Findings: Lesion (New nonpigmented nevus of the right cheek) present. No rash.  Neurological:     Mental Status: He is alert and oriented to person, place, and time.  Coordination: Coordination normal.  Psychiatric:        Behavior: Behavior normal.      Assessment & Plan:   Problem List Items Addressed This Visit       Cardiovascular and Mediastinum   Hypertension   Relevant Medications   amLODipine (NORVASC) 5 MG tablet     Digestive   Herpes labialis without complication - Primary   Relevant Medications   acyclovir (ZOVIRAX) 400 MG tablet   Other Visit Diagnoses     Need for shingles vaccine       Relevant Orders   Varicella-zoster vaccine IM (Shingrix) (Completed)   Elevated blood pressure reading       Nonpigmented nevus       Relevant Orders   Ambulatory referral to Dermatology       Will start amlodipine for blood pressure, he has been elevated the last few times. Follow up plan: Return if symptoms worsen or fail to improve, for Hypertension and physical in 2 to 3 months.  Counseling provided for all of the vaccine components Orders Placed This Encounter  Procedures    Varicella-zoster vaccine IM (Shingrix)   Ambulatory referral to Dermatology    Caryl Pina, MD [facility] 05/18/2021, 9:40 AM

## 2021-05-29 DIAGNOSIS — R600 Localized edema: Secondary | ICD-10-CM | POA: Diagnosis not present

## 2021-05-30 DIAGNOSIS — L720 Epidermal cyst: Secondary | ICD-10-CM | POA: Diagnosis not present

## 2021-05-30 DIAGNOSIS — L820 Inflamed seborrheic keratosis: Secondary | ICD-10-CM | POA: Diagnosis not present

## 2021-05-30 DIAGNOSIS — L821 Other seborrheic keratosis: Secondary | ICD-10-CM | POA: Diagnosis not present

## 2021-11-15 ENCOUNTER — Other Ambulatory Visit: Payer: Self-pay | Admitting: Family Medicine

## 2021-11-15 DIAGNOSIS — I10 Essential (primary) hypertension: Secondary | ICD-10-CM

## 2021-12-11 ENCOUNTER — Telehealth: Payer: Self-pay | Admitting: Family Medicine

## 2021-12-11 DIAGNOSIS — I10 Essential (primary) hypertension: Secondary | ICD-10-CM

## 2021-12-11 MED ORDER — AMLODIPINE BESYLATE 5 MG PO TABS: 5.0000 mg | ORAL_TABLET | Freq: Every day | ORAL | 0 refills | Status: DC

## 2021-12-11 NOTE — Telephone Encounter (Signed)
  Prescription Request  12/11/2021  Is this a "Controlled Substance" medicine? no  Have you seen your PCP in the last 2 weeks? Sept 14.23 /1st availlable  If YES, route message to pool  -  If NO, patient needs to be scheduled for appointment.  What is the name of the medication or equipment?amLODipine  Have you contacted your pharmacy to request a refill? yes   Which pharmacy would you like this sent to? [facility]   Patient notified that their request is being sent to the clinical staff for review and that they should receive a response within 2 business days.

## 2021-12-11 NOTE — Telephone Encounter (Signed)
Pt aware refill sent to pharmacy 

## 2022-01-10 ENCOUNTER — Encounter: Payer: Self-pay | Admitting: Family Medicine

## 2022-01-10 ENCOUNTER — Ambulatory Visit (INDEPENDENT_AMBULATORY_CARE_PROVIDER_SITE_OTHER): Payer: Medicare Other | Admitting: Family Medicine

## 2022-01-10 VITALS — BP 139/59 | HR 80 | Temp 98.4°F | Ht 70.0 in | Wt 185.0 lb

## 2022-01-10 DIAGNOSIS — Z23 Encounter for immunization: Secondary | ICD-10-CM

## 2022-01-10 DIAGNOSIS — I10 Essential (primary) hypertension: Secondary | ICD-10-CM | POA: Diagnosis not present

## 2022-01-10 DIAGNOSIS — N400 Enlarged prostate without lower urinary tract symptoms: Secondary | ICD-10-CM | POA: Diagnosis not present

## 2022-01-10 DIAGNOSIS — B001 Herpesviral vesicular dermatitis: Secondary | ICD-10-CM | POA: Diagnosis not present

## 2022-01-10 MED ORDER — AMLODIPINE BESYLATE 5 MG PO TABS: 5.0000 mg | ORAL_TABLET | Freq: Every day | ORAL | 3 refills | Status: DC

## 2022-01-10 MED ORDER — ACYCLOVIR 400 MG PO TABS: 400.0000 mg | ORAL_TABLET | Freq: Three times a day (TID) | ORAL | 3 refills | Status: DC

## 2022-01-10 NOTE — Progress Notes (Signed)
BP (!) 139/59   Pulse 80   Temp 98.4 F (36.9 C)   Ht _0  (1.778 m)   Wt 185 lb (83.9 kg)   SpO2 96%   BMI 26.54 kg/m    Subjective:   Patient ID: Christopher Herring, male    DOB: 07-12-1950, 71 y.o.   MRN: [id number]  HPI: Christopher Herring is a 71 y.o. male presenting on 01/10/2022 for Medical Management of Chronic Issues and Hypertension   HPI Hypertension Patient is currently on amlodipine, and their blood pressure today is 139/59. Patient denies any lightheadedness or dizziness. Patient denies headaches, blurred vision, chest pains, shortness of breath, or weakness. Denies any side effects from medication and is content with current medication.   BPH Patient is coming in for recheck on BPH Symptoms: None currently Medication: None Last PSA: Over 2 years ago  Patient has herpes labialis oral and keeps acyclovir on hand for when he gets episodes and just takes it when he gets flareups.  Seems to control.  Relevant past medical, surgical, family and social history reviewed and updated as indicated. Interim medical history since our last visit reviewed. Allergies and medications reviewed and updated.  Review of Systems  Constitutional:  Negative for chills and fever.  Eyes:  Negative for visual disturbance.  Respiratory:  Negative for shortness of breath and wheezing.   Cardiovascular:  Negative for chest pain and leg swelling.  Musculoskeletal:  Negative for back pain and gait problem.  Skin:  Negative for rash.  Neurological:  Negative for dizziness, weakness and light-headedness.  All other systems reviewed and are negative.   Per HPI unless specifically indicated above   Allergies as of 01/10/2022   No Known Allergies      Medication List        Accurate as of January 10, 2022  2:08 PM. If you have any questions, ask your nurse or doctor.          acyclovir 400 MG tablet Commonly known as: ZOVIRAX Take 1 tablet (400 mg total) by mouth 3 (three) times  daily. x5-10 days (per cold sore episode)   amLODipine 5 MG tablet Commonly known as: NORVASC Take 1 tablet (5 mg total) by mouth daily.         Objective:   BP (!) 139/59   Pulse 80   Temp 98.4 F (36.9 C)   Ht _1  (1.778 m)   Wt 185 lb (83.9 kg)   SpO2 96%   BMI 26.54 kg/m   Wt Readings from Last 3 Encounters:  01/10/22 185 lb (83.9 kg)  05/18/21 184 lb (83.5 kg)  06/11/19 191 lb (86.6 kg)    Physical Exam Vitals and nursing note reviewed.  Constitutional:      General: He is not in acute distress.    Appearance: He is well-developed. He is not diaphoretic.  Eyes:     General: No scleral icterus.    Conjunctiva/sclera: Conjunctivae normal.  Neck:     Thyroid: No thyromegaly.  Cardiovascular:     Rate and Rhythm: Normal rate and regular rhythm.     Heart sounds: Normal heart sounds. No murmur heard. Pulmonary:     Effort: Pulmonary effort is normal. No respiratory distress.     Breath sounds: Normal breath sounds. No wheezing.  Musculoskeletal:        General: No swelling. Normal range of motion.     Cervical back: Neck supple.  Lymphadenopathy:  Cervical: No cervical adenopathy.  Skin:    General: Skin is warm and dry.     Findings: No rash.  Neurological:     Mental Status: He is alert and oriented to person, place, and time.     Coordination: Coordination normal.  Psychiatric:        Behavior: Behavior normal.       Assessment & Plan:   Problem List Items Addressed This Visit       Cardiovascular and Mediastinum   Hypertension - Primary   Relevant Medications   amLODipine (NORVASC) 5 MG tablet   Other Relevant Orders   CBC with Differential/Platelet   CMP14+EGFR   Lipid panel     Digestive   Herpes labialis without complication   Relevant Medications   acyclovir (ZOVIRAX) 400 MG tablet     Other   Enlarged prostate   Relevant Orders   PSA, total and free   Other Visit Diagnoses     Need for shingles vaccine        Relevant Orders   Zoster Recombinant (Shingrix ) (Completed)       Continue current medicine, will do refills, see back yearly.  Monitor blood pressure at home Follow up plan: Return in about 1 year (around 01/11/2023), or if symptoms worsen or fail to improve, for Hypertension and BPH.  Counseling provided for all of the vaccine components Orders Placed This Encounter  Procedures   Zoster Recombinant (Shingrix )   CBC with Differential/Platelet   CMP14+EGFR   Lipid panel   PSA, total and free    Caryl Pina, MD [facility] 01/10/2022, 2:08 PM

## 2022-01-11 LAB — CMP14+EGFR
ALT: 23 IU/L (ref 0–44)
AST: 20 IU/L (ref 0–40)
Albumin/Globulin Ratio: 2 (ref 1.2–2.2)
Albumin: 4.4 g/dL (ref 3.8–4.8)
Alkaline Phosphatase: 51 IU/L (ref 44–121)
BUN/Creatinine Ratio: 14 (ref 10–24)
BUN: 11 mg/dL (ref 8–27)
Bilirubin Total: 0.3 mg/dL (ref 0.0–1.2)
CO2: 23 mmol/L (ref 20–29)
Calcium: 9.2 mg/dL (ref 8.6–10.2)
Chloride: 102 mmol/L (ref 96–106)
Creatinine, Ser: 0.78 mg/dL (ref 0.76–1.27)
Globulin, Total: 2.2 g/dL (ref 1.5–4.5)
Glucose: 95 mg/dL (ref 70–99)
Potassium: 4 mmol/L (ref 3.5–5.2)
Sodium: 140 mmol/L (ref 134–144)
Total Protein: 6.6 g/dL (ref 6.0–8.5)
eGFR: 95 mL/min/{1.73_m2} (ref >59)

## 2022-01-11 LAB — CBC WITH DIFFERENTIAL/PLATELET
Basophils Absolute: 0 10*3/uL (ref 0.0–0.2)
Basos: 0 %
EOS (ABSOLUTE): 0.1 10*3/uL (ref 0.0–0.4)
Eos: 1 %
Hematocrit: 42.8 % (ref 37.5–51.0)
Hemoglobin: 15.1 g/dL (ref 13.0–17.7)
Immature Grans (Abs): 0 10*3/uL (ref 0.0–0.1)
Immature Granulocytes: 0 %
Lymphocytes Absolute: 2.1 10*3/uL (ref 0.7–3.1)
Lymphs: 19 %
MCH: 31.7 pg (ref 26.6–33.0)
MCHC: 35.3 g/dL (ref 31.5–35.7)
MCV: 90 fL (ref 79–97)
Monocytes Absolute: 1.4 10*3/uL — ABNORMAL HIGH (ref 0.1–0.9)
Monocytes: 12 %
Neutrophils Absolute: 7.6 10*3/uL — ABNORMAL HIGH (ref 1.4–7.0)
Neutrophils: 68 %
Platelets: 295 10*3/uL (ref 150–450)
RBC: 4.76 x10E6/uL (ref 4.14–5.80)
RDW: 12.9 % (ref 11.6–15.4)
WBC: 11.2 10*3/uL — ABNORMAL HIGH (ref 3.4–10.8)

## 2022-01-11 LAB — LIPID PANEL
Chol/HDL Ratio: 4.6 ratio (ref 0.0–5.0)
Cholesterol, Total: 223 mg/dL — ABNORMAL HIGH (ref 100–199)
HDL: 48 mg/dL (ref >39)
LDL Chol Calc (NIH): 150 mg/dL — ABNORMAL HIGH (ref 0–99)
Triglycerides: 139 mg/dL (ref 0–149)
VLDL Cholesterol Cal: 25 mg/dL (ref 5–40)

## 2022-01-11 LAB — PSA, TOTAL AND FREE
PSA, Free Pct: 41.7 %
PSA, Free: 0.25 ng/mL
Prostate Specific Ag, Serum: 0.6 ng/mL (ref 0.0–4.0)

## 2022-01-17 MED ORDER — ROSUVASTATIN CALCIUM 5 MG PO TABS: 5.0000 mg | ORAL_TABLET | Freq: Every day | ORAL | 3 refills | Status: DC

## 2022-01-17 NOTE — Addendum Note (Signed)
Addended by: Ladean Raya on: 01/17/2022 02:22 PM   Modules accepted: Orders

## 2022-05-02 ENCOUNTER — Ambulatory Visit (INDEPENDENT_AMBULATORY_CARE_PROVIDER_SITE_OTHER): Payer: Medicare Other

## 2022-05-02 VITALS — Ht 70.0 in | Wt 181.0 lb

## 2022-05-02 DIAGNOSIS — Z0000 Encounter for general adult medical examination without abnormal findings: Secondary | ICD-10-CM | POA: Diagnosis not present

## 2022-05-02 DIAGNOSIS — Z122 Encounter for screening for malignant neoplasm of respiratory organs: Secondary | ICD-10-CM

## 2022-05-02 NOTE — Patient Instructions (Signed)
Christopher Herring , Thank you for taking time to come for your Medicare Wellness Visit. I appreciate your ongoing commitment to your health goals. Please review the following plan we discussed and let me know if I can assist you in the future.   These are the goals we discussed:  Goals      DIET - EAT MORE FRUITS AND VEGETABLES     DIET - REDUCE CALORIE INTAKE        This is a list of the screening recommended for you and due dates:  Health Maintenance  Topic Date Due   DTaP/Tdap/Td vaccine (1 - Tdap) Never done   Screening for Lung Cancer  Never done   COVID-19 Vaccine (3 - Pfizer risk series) 08/22/2019   Pneumonia Vaccine (1 - PCV) 05/18/2022*   Colon Cancer Screening  05/18/2022*   Flu Shot  07/28/2022*   Medicare Annual Wellness Visit  05/03/2023   Hepatitis C Screening: USPSTF Recommendation to screen - Ages 18-79 yo.  Completed   Zoster (Shingles) Vaccine  Completed   HPV Vaccine  Aged Out  *Topic was postponed. The date shown is not the original due date.    Advanced directives: Please bring a copy of your health care power of attorney and living will to the office to be added to your chart at your convenience.   Conditions/risks identified: Aim for 30 minutes of exercise or brisk walking, 6-8 glasses of water, and 5 servings of fruits and vegetables each day.   Next appointment: Follow up in one year for your annual wellness visit.   Preventive Care 19 Years and Older, Male  Preventive care refers to lifestyle choices and visits with your health care provider that can promote health and wellness. What does preventive care include? A yearly physical exam. This is also called an annual well check. Dental exams once or twice a year. Routine eye exams. Ask your health care provider how often you should have your eyes checked. Personal lifestyle choices, including: Daily care of your teeth and gums. Regular physical activity. Eating a healthy diet. Avoiding tobacco and drug  use. Limiting alcohol use. Practicing safe sex. Taking low doses of aspirin every day. Taking vitamin and mineral supplements as recommended by your health care provider. What happens during an annual well check? The services and screenings done by your health care provider during your annual well check will depend on your age, overall health, lifestyle risk factors, and family history of disease. Counseling  Your health care provider may ask you questions about your: Alcohol use. Tobacco use. Drug use. Emotional well-being. Home and relationship well-being. Sexual activity. Eating habits. History of falls. Memory and ability to understand (cognition). Work and work Statistician. Screening  You may have the following tests or measurements: Height, weight, and BMI. Blood pressure. Lipid and cholesterol levels. These may be checked every 5 years, or more frequently if you are over 51 years old. Skin check. Lung cancer screening. You may have this screening every year starting at age 56 if you have a 30-pack-year history of smoking and currently smoke or have quit within the past 15 years. Fecal occult blood test (FOBT) of the stool. You may have this test every year starting at age 48. Flexible sigmoidoscopy or colonoscopy. You may have a sigmoidoscopy every 5 years or a colonoscopy every 10 years starting at age 51. Prostate cancer screening. Recommendations will vary depending on your family history and other risks. Hepatitis C blood test. Hepatitis B  blood test. Sexually transmitted disease (STD) testing. Diabetes screening. This is done by checking your blood sugar (glucose) after you have not eaten for a while (fasting). You may have this done every 1-3 years. Abdominal aortic aneurysm (AAA) screening. You may need this if you are a current or former smoker. Osteoporosis. You may be screened starting at age 1 if you are at high risk. Talk with your health care provider about  your test results, treatment options, and if necessary, the need for more tests. Vaccines  Your health care provider may recommend certain vaccines, such as: Influenza vaccine. This is recommended every year. Tetanus, diphtheria, and acellular pertussis (Tdap, Td) vaccine. You may need a Td booster every 10 years. Zoster vaccine. You may need this after age 67. Pneumococcal 13-valent conjugate (PCV13) vaccine. One dose is recommended after age 41. Pneumococcal polysaccharide (PPSV23) vaccine. One dose is recommended after age 12. Talk to your health care provider about which screenings and vaccines you need and how often you need them. This information is not intended to replace advice given to you by your health care provider. Make sure you discuss any questions you have with your health care provider. Document Released: 05/12/2015 Document Revised: 01/03/2016 Document Reviewed: 02/14/2015 Elsevier Interactive Patient Education  2017 [place] Prevention in the Home Falls can cause injuries. They can happen to people of all ages. There are many things you can do to make your home safe and to help prevent falls. What can I do on the outside of my home? Regularly fix the edges of walkways and driveways and fix any cracks. Remove anything that might make you trip as you walk through a door, such as a raised step or threshold. Trim any bushes or trees on the path to your home. Use bright outdoor lighting. Clear any walking paths of anything that might make someone trip, such as rocks or tools. Regularly check to see if handrails are loose or broken. Make sure that both sides of any steps have handrails. Any raised decks and porches should have guardrails on the edges. Have any leaves, snow, or ice cleared regularly. Use sand or salt on walking paths during winter. Clean up any spills in your garage right away. This includes oil or grease spills. What can I do in the bathroom? Use  night lights. Install grab bars by the toilet and in the tub and shower. Do not use towel bars as grab bars. Use non-skid mats or decals in the tub or shower. If you need to sit down in the shower, use a plastic, non-slip stool. Keep the floor dry. Clean up any water that spills on the floor as soon as it happens. Remove soap buildup in the tub or shower regularly. Attach bath mats securely with double-sided non-slip rug tape. Do not have throw rugs and other things on the floor that can make you trip. What can I do in the bedroom? Use night lights. Make sure that you have a light by your bed that is easy to reach. Do not use any sheets or blankets that are too big for your bed. They should not hang down onto the floor. Have a firm chair that has side arms. You can use this for support while you get dressed. Do not have throw rugs and other things on the floor that can make you trip. What can I do in the kitchen? Clean up any spills right away. Avoid walking on wet floors. Keep items  that you use a lot in easy-to-reach places. If you need to reach something above you, use a strong step stool that has a grab bar. Keep electrical cords out of the way. Do not use floor polish or wax that makes floors slippery. If you must use wax, use non-skid floor wax. Do not have throw rugs and other things on the floor that can make you trip. What can I do with my stairs? Do not leave any items on the stairs. Make sure that there are handrails on both sides of the stairs and use them. Fix handrails that are broken or loose. Make sure that handrails are as long as the stairways. Check any carpeting to make sure that it is firmly attached to the stairs. Fix any carpet that is loose or worn. Avoid having throw rugs at the top or bottom of the stairs. If you do have throw rugs, attach them to the floor with carpet tape. Make sure that you have a light switch at the top of the stairs and the bottom of the  stairs. If you do not have them, ask someone to add them for you. What else can I do to help prevent falls? Wear shoes that: Do not have high heels. Have rubber bottoms. Are comfortable and fit you well. Are closed at the toe. Do not wear sandals. If you use a stepladder: Make sure that it is fully opened. Do not climb a closed stepladder. Make sure that both sides of the stepladder are locked into place. Ask someone to hold it for you, if possible. Clearly mark and make sure that you can see: Any grab bars or handrails. First and last steps. Where the edge of each step is. Use tools that help you move around (mobility aids) if they are needed. These include: Canes. Walkers. Scooters. Crutches. Turn on the lights when you go into a dark area. Replace any light bulbs as soon as they burn out. Set up your furniture so you have a clear path. Avoid moving your furniture around. If any of your floors are uneven, fix them. If there are any pets around you, be aware of where they are. Review your medicines with your doctor. Some medicines can make you feel dizzy. This can increase your chance of falling. Ask your doctor what other things that you can do to help prevent falls. This information is not intended to replace advice given to you by your health care provider. Make sure you discuss any questions you have with your health care provider. Document Released: 02/09/2009 Document Revised: 09/21/2015 Document Reviewed: 05/20/2014 Elsevier Interactive Patient Education  2017 [employer].

## 2022-05-02 NOTE — Progress Notes (Signed)
Subjective:   Christopher Herring is a 72 y.o. male who presents for Medicare Annual/Subsequent preventive examination. I connected with  Christopher Herring on 05/02/22 by a audio enabled telemedicine application and verified that I am speaking with the correct person using two identifiers.  Patient Location: Home  Provider Location: Home Office  I discussed the limitations of evaluation and management by telemedicine. The patient expressed understanding and agreed to proceed.  Review of Systems     Cardiac Risk Factors include: advanced age (>69mn, >>52women);dyslipidemia;male gender;hypertension     Objective:    Today's Vitals   05/02/22 0910  Weight: 181 lb (82.1 kg)  Height: '5\' 10"'$  (1.778 m)   Body mass index is 25.97 kg/m.     05/02/2022    9:13 AM 04/30/2021    6:22 PM 04/25/2021   10:13 AM  Advanced Directives  Does Patient Have a Medical Advance Directive? Yes No No  Type of [place]amed[place]    Copy of [place] Chart? No - copy requested    Would patient like information on creating a medical advance directive?  No - Patient declined No - Patient declined    Current Medications (verified) Outpatient Encounter Medications as of 05/02/2022  Medication Sig   acyclovir (ZOVIRAX) 400 MG tablet Take 1 tablet (400 mg total) by mouth 3 (three) times daily. x5-10 days (per cold sore episode)   amLODipine (NORVASC) 5 MG tablet Take 1 tablet (5 mg total) by mouth daily.   rosuvastatin (CRESTOR) 5 MG tablet Take 1 tablet (5 mg total) by mouth daily.   No facility-administered encounter medications on file as of 05/02/2022.    Allergies (verified) Patient has no known allergies.   History: History reviewed. No pertinent past medical history. Past Surgical History:  Procedure Laterality Date   APPENDECTOMY  1976   BACK SURGERY  1996   L4-5   Family History  Problem Relation Age of Onset   Diabetes Mother     Alzheimer's disease Mother    Heart disease Father    Cancer Sister    Social History   Socioeconomic History   Marital status: Divorced    Spouse name: Not on file   Number of children: 3   Years of education: 14   Highest education level: Not on file  Occupational History   Not on file  Tobacco Use   Smoking status: Former    Packs/day: 0.75    Years: 50.00    Total pack years: 37.50    Types: Cigarettes    Quit date: 2020    Years since quitting: 4.0   Smokeless tobacco: Former    Types: Snuff  Vaping Use   Vaping Use: Former  Substance and Sexual Activity   Alcohol use: Yes    Alcohol/week: 10.0 standard drinks of alcohol    Types: 10 Glasses of wine per week   Drug use: No   Sexual activity: Not on file  Other Topics Concern   Not on file  Social History Narrative   Not on file   Social Determinants of Health   Financial Resource Strain: Low Risk  (05/02/2022)   Overall Financial Resource Strain (CARDIA)    Difficulty of Paying Living Expenses: Not hard at all  Food Insecurity: No Food Insecurity (05/02/2022)   Hunger Vital Sign    Worried About Running Out of Food in the Last Year: Never true    Ran  Out of Food in the Last Year: Never true  Transportation Needs: No Transportation Needs (05/02/2022)   PRAPARE - Hydrologist (Medical): No    Lack of Transportation (Non-Medical): No  Physical Activity: Sufficiently Active (05/02/2022)   Exercise Vital Sign    Days of Exercise per Week: 5 days    Minutes of Exercise per Session: 30 min  Stress: No Stress Concern Present (05/02/2022)   [place]    Feeling of Stress : Not at all  Social Connections: Moderately Isolated (05/02/2022)   Social Connection and Isolation Panel [NHANES]    Frequency of Communication with Friends and Family: More than three times a week    Frequency of Social Gatherings with Friends and Family: More  than three times a week    Attends Religious Services: Never    Marine scientist or Organizations: No    Attends Music therapist: Never    Marital Status: Living with partner    Tobacco Counseling Counseling given: Not Answered   Clinical Intake:  Pre-visit preparation completed: Yes  Pain : No/denies pain     Nutritional Risks: None Diabetes: No  How often do you need to have someone help you when you read instructions, pamphlets, or other written materials from your doctor or pharmacy?: 1 - Never  Diabetic?no   Interpreter Needed?: No  Information entered by :: Christopher Pierini, LPN   Activities of Daily Living    05/02/2022    9:13 AM 04/28/2022    5:00 PM  In your present state of health, do you have any difficulty performing the following activities:  Hearing? 0 0  Vision? 0 0  Difficulty concentrating or making decisions? 0 0  Walking or climbing stairs? 0 0  Dressing or bathing? 0 0  Doing errands, shopping? 0 0  Preparing Food and eating ? N N  Using the Toilet? N N  In the past six months, have you accidently leaked urine? N N  Do you have problems with loss of bowel control? N N  Managing your Medications? N N  Managing your Finances? N N  Housekeeping or managing your Housekeeping? N N    Patient Care Team: Dettinger, Fransisca Kaufmann, MD as PCP - General (Family Medicine)  Indicate any recent Medical Services you may have received from other than [facility] providers in the past year (date may be approximate).     Assessment:   This is a routine wellness examination for Christopher Herring.  Hearing/Vision screen Vision Screening - Comments:: Wears rx glasses - up to date with routine eye exams with  Dr.Johnson   Dietary issues and exercise activities discussed: Current Exercise Habits: Home exercise routine, Type of exercise: walking, Time (Minutes): 30, Frequency (Times/Week): 5, Weekly Exercise (Minutes/Week): 150, Intensity: Mild, Exercise limited by:  None identified   Goals Addressed             This Visit's Progress    DIET - EAT MORE FRUITS AND VEGETABLES   On track      Depression Screen    05/02/2022    9:11 AM 01/10/2022    1:38 PM 05/18/2021    8:49 AM 04/25/2021    9:54 AM 06/11/2019    1:02 PM 06/30/2018   11:23 AM 06/09/2017    8:09 AM  PHQ 2/9 Scores  PHQ - 2 Score 0 2 0 0 0 4 0  PHQ- 9 Score  3    6     Fall Risk    05/02/2022    9:10 AM 04/28/2022    5:00 PM 01/10/2022    1:38 PM 05/18/2021    8:49 AM 04/25/2021    9:53 AM  Fall Risk   Falls in the past year? 0 0 0 0 0  Number falls in past yr: 0 0     Injury with Fall? 0 0     Risk for fall due to : No Fall Risks      Follow up Falls prevention discussed        [place]:  Any stairs in or around the home? Yes  If so, are there any without handrails? No  Home free of loose throw rugs in walkways, pet beds, electrical cords, etc? Yes  Adequate lighting in your home to reduce risk of falls? Yes   ASSISTIVE DEVICES UTILIZED TO PREVENT FALLS:  Life alert? No  Use of a cane, walker or w/c? No  Grab bars in the bathroom? Yes  Shower chair or bench in shower? Yes  Elevated toilet seat or a handicapped toilet? Yes          05/02/2022    9:13 AM 04/25/2021   10:04 AM  6CIT Screen  What Year? 0 points 0 points  What month? 0 points 0 points  What time? 0 points 0 points  Count back from 20 0 points 0 points  Months in reverse 0 points 0 points  Repeat phrase 0 points 0 points  Total Score 0 points 0 points    Immunizations Immunization History  Administered Date(s) Administered   PFIZER(Purple Top)SARS-COV-2 Vaccination 07/04/2019, 07/25/2019   Zoster Recombinat (Shingrix) 05/18/2021, 01/10/2022    TDAP status: Due, Education has been provided regarding the importance of this vaccine. Advised may receive this vaccine at local pharmacy or Health Dept. Aware to provide a copy of the vaccination record if  obtained from local pharmacy or Health Dept. Verbalized acceptance and understanding.  Flu Vaccine status: Declined, Education has been provided regarding the importance of this vaccine but patient still declined. Advised may receive this vaccine at local pharmacy or Health Dept. Aware to provide a copy of the vaccination record if obtained from local pharmacy or Health Dept. Verbalized acceptance and understanding.  Pneumococcal vaccine status: Due, Education has been provided regarding the importance of this vaccine. Advised may receive this vaccine at local pharmacy or Health Dept. Aware to provide a copy of the vaccination record if obtained from local pharmacy or Health Dept. Verbalized acceptance and understanding.  Covid-19 vaccine status: Completed vaccines  Qualifies for Shingles Vaccine? Yes   Zostavax completed Yes   Shingrix Completed?: Yes  Screening Tests Health Maintenance  Topic Date Due   DTaP/Tdap/Td (1 - Tdap) Never done   Lung Cancer Screening  Never done   COVID-19 Vaccine (3 - Pfizer risk series) 08/22/2019   Pneumonia Vaccine 50+ Years old (1 - PCV) 05/18/2022 (Originally 09/05/2014)   COLONOSCOPY (Pts 45-79yr Insurance coverage will need to be confirmed)  05/18/2022 (Originally 03/17/2021)   INFLUENZA VACCINE  07/28/2022 (Originally 11/27/2021)   Medicare Annual Wellness (AWV)  05/03/2023   Hepatitis C Screening  Completed   Zoster Vaccines- Shingrix  Completed   HPV VACCINES  Aged Out    Health Maintenance  Health Maintenance Due  Topic Date Due   DTaP/Tdap/Td (1 - Tdap) Never done   Lung Cancer Screening  Never  done   COVID-19 Vaccine (3 - Pfizer risk series) 08/22/2019    Colorectal cancer screening: Referral to GI placed declined . Pt aware the office will call re: appt.  Lung Cancer Screening: (Low Dose CT Chest recommended if Age 65-80 years, 30 pack-year currently smoking OR have quit w/in 15years.) does qualify.   Lung Cancer Screening Referral:  referral 05/02/2022  Additional Screening:  Hepatitis C Screening: does not qualify; Completed 06/11/2019  Vision Screening: Recommended annual ophthalmology exams for early detection of glaucoma and other disorders of the eye. Is the patient up to date with their annual eye exam?  Yes  Who is the provider or what is the name of the office in which the patient attends annual eye exams? Dr.Johnson  If pt is not established with a provider, would they like to be referred to a provider to establish care? No .   Dental Screening: Recommended annual dental exams for proper oral hygiene  Community Resource Referral / Chronic Care Management: CRR required this visit?  No   CCM required this visit?  No      Plan:     I have personally reviewed and noted the following in the patient's chart:   Medical and social history Use of alcohol, tobacco or illicit drugs  Current medications and supplements including opioid prescriptions. Patient is not currently taking opioid prescriptions. Functional ability and status Nutritional status Physical activity Advanced directives List of other physicians Hospitalizations, surgeries, and ER visits in previous 12 months Vitals Screenings to include cognitive, depression, and falls Referrals and appointments  In addition, I have reviewed and discussed with patient certain preventive protocols, quality metrics, and best practice recommendations. A written personalized care plan for preventive services as well as general preventive health recommendations were provided to patient.     Daphane Shepherd, LPN   06/29/8248   Nurse Notes: Due Tdap /pneumonia vaccine

## 2023-01-06 ENCOUNTER — Other Ambulatory Visit: Payer: Self-pay | Admitting: Family Medicine

## 2023-01-06 DIAGNOSIS — I10 Essential (primary) hypertension: Secondary | ICD-10-CM

## 2023-02-10 ENCOUNTER — Other Ambulatory Visit: Payer: Self-pay | Admitting: Family Medicine

## 2023-02-10 DIAGNOSIS — I10 Essential (primary) hypertension: Secondary | ICD-10-CM

## 2023-02-14 ENCOUNTER — Encounter: Payer: Self-pay | Admitting: Family Medicine

## 2023-02-14 ENCOUNTER — Ambulatory Visit (INDEPENDENT_AMBULATORY_CARE_PROVIDER_SITE_OTHER): Payer: Medicare Other | Admitting: Family Medicine

## 2023-02-14 VITALS — BP 124/74 | HR 75 | Ht 70.0 in | Wt 188.0 lb

## 2023-02-14 DIAGNOSIS — B001 Herpesviral vesicular dermatitis: Secondary | ICD-10-CM | POA: Diagnosis not present

## 2023-02-14 DIAGNOSIS — E785 Hyperlipidemia, unspecified: Secondary | ICD-10-CM | POA: Insufficient documentation

## 2023-02-14 DIAGNOSIS — Z1211 Encounter for screening for malignant neoplasm of colon: Secondary | ICD-10-CM

## 2023-02-14 DIAGNOSIS — I10 Essential (primary) hypertension: Secondary | ICD-10-CM

## 2023-02-14 DIAGNOSIS — N400 Enlarged prostate without lower urinary tract symptoms: Secondary | ICD-10-CM

## 2023-02-14 DIAGNOSIS — E782 Mixed hyperlipidemia: Secondary | ICD-10-CM | POA: Diagnosis not present

## 2023-02-14 MED ORDER — AMLODIPINE BESYLATE 5 MG PO TABS
5.0000 mg | ORAL_TABLET | Freq: Every day | ORAL | 3 refills | Status: DC
Start: 2023-02-14 — End: 2024-01-16

## 2023-02-14 MED ORDER — ROSUVASTATIN CALCIUM 5 MG PO TABS
5.0000 mg | ORAL_TABLET | Freq: Every day | ORAL | 3 refills | Status: DC
Start: 2023-02-14 — End: 2024-01-16

## 2023-02-14 MED ORDER — ACYCLOVIR 400 MG PO TABS
400.0000 mg | ORAL_TABLET | Freq: Three times a day (TID) | ORAL | 3 refills | Status: DC
Start: 2023-02-14 — End: 2024-01-16

## 2023-02-14 NOTE — Progress Notes (Signed)
BP 124/74   Pulse 75   Ht 5\' 10"  (1.778 m)   Wt 188 lb (85.3 kg)   SpO2 96%   BMI 26.98 kg/m    Subjective:   Patient ID: Christopher Herring, male    DOB: Apr 17, 1951, 72 y.o.   MRN: [id number]  HPI: Christopher Herring is a 72 y.o. male presenting on 02/14/2023 for Medical Management of Chronic Issues and Hypertension   HPI Hypertension Patient is currently on amlodipine, and their blood pressure today is 124/74. Patient denies any lightheadedness or dizziness. Patient denies headaches, blurred vision, chest pains, shortness of breath, or weakness. Denies any side effects from medication and is content with current medication.   Hyperlipidemia Patient is coming in for recheck of his hyperlipidemia. The patient is currently taking Crestor. They deny any issues with myalgias or history of liver damage from it. They deny any focal numbness or weakness or chest pain.   BPH Patient is coming in for recheck on BPH Symptoms: none currently Medication:  none Last PSA: 1 year   Patient has had a history of cold sores and takes acyclovir when he needs it for it.  He denies any major issues with that and the medicine does help when he gets  Relevant past medical, surgical, family and social history reviewed and updated as indicated. Interim medical history since our last visit reviewed. Allergies and medications reviewed and updated.  Review of Systems  Per HPI unless specifically indicated above   Allergies as of 02/14/2023   No Known Allergies      Medication List        Accurate as of February 14, 2023  4:26 PM. If you have any questions, ask your nurse or doctor.          acyclovir 400 MG tablet Commonly known as: ZOVIRAX Take 1 tablet (400 mg total) by mouth 3 (three) times daily. x5-10 days (per cold sore episode)   amLODipine 5 MG tablet Commonly known as: NORVASC Take 1 tablet (5 mg total) by mouth daily. What changed: additional instructions Changed by: Elige Radon  Lashundra Shiveley   rosuvastatin 5 MG tablet Commonly known as: CRESTOR Take 1 tablet (5 mg total) by mouth daily. What changed: additional instructions Changed by: Elige Radon Fawnda Vitullo         Objective:   BP 124/74   Pulse 75   Ht 5\' 10"  (1.778 m)   Wt 188 lb (85.3 kg)   SpO2 96%   BMI 26.98 kg/m   Wt Readings from Last 3 Encounters:  02/14/23 188 lb (85.3 kg)  05/02/22 181 lb (82.1 kg)  01/10/22 185 lb (83.9 kg)    Physical Exam Vitals and nursing note reviewed.  Constitutional:      General: He is not in acute distress.    Appearance: He is well-developed. He is not diaphoretic.  Eyes:     General: No scleral icterus.    Conjunctiva/sclera: Conjunctivae normal.  Neck:     Thyroid: No thyromegaly.  Cardiovascular:     Rate and Rhythm: Normal rate and regular rhythm.     Heart sounds: Normal heart sounds. No murmur heard. Pulmonary:     Effort: Pulmonary effort is normal. No respiratory distress.     Breath sounds: Normal breath sounds. No wheezing.  Musculoskeletal:        General: No swelling. Normal range of motion.     Cervical back: Neck supple.  Lymphadenopathy:     Cervical: No  cervical adenopathy.  Skin:    General: Skin is warm and dry.     Findings: No rash.  Neurological:     Mental Status: He is alert and oriented to person, place, and time.     Coordination: Coordination normal.  Psychiatric:        Behavior: Behavior normal.       Assessment & Plan:   Problem List Items Addressed This Visit       Cardiovascular and Mediastinum   Hypertension - Primary   Relevant Medications   amLODipine (NORVASC) 5 MG tablet   rosuvastatin (CRESTOR) 5 MG tablet   Other Relevant Orders   CBC with Differential/Platelet   CMP14+EGFR   Lipid panel   PSA, total and free   TSH     Digestive   Herpes labialis without complication   Relevant Medications   acyclovir (ZOVIRAX) 400 MG tablet     Other   Hyperlipidemia   Relevant Medications   amLODipine  (NORVASC) 5 MG tablet   rosuvastatin (CRESTOR) 5 MG tablet   Enlarged prostate   Relevant Orders   PSA, total and free   Other Visit Diagnoses     Colon cancer screening       Relevant Orders   Cologuard       Will await blood work to see how it goes but blood pressure and everything else looks good today, no medication changes today. Follow up plan: Return in about 1 year (around 02/14/2024), or if symptoms worsen or fail to improve, for Hypertension and hyperlipidemia and BPH recheck.  Counseling provided for all of the vaccine components Orders Placed This Encounter  Procedures   CBC with Differential/Platelet   CMP14+EGFR   Lipid panel   PSA, total and free   TSH   Cologuard    Arville Care, MD [place] [facility] 02/14/2023, 4:26 PM

## 2023-02-15 LAB — CBC WITH DIFFERENTIAL/PLATELET
Basophils Absolute: 0 10*3/uL (ref 0.0–0.2)
Basos: 1 %
EOS (ABSOLUTE): 0.1 10*3/uL (ref 0.0–0.4)
Eos: 2 %
Hematocrit: 45.6 % (ref 37.5–51.0)
Hemoglobin: 15.3 g/dL (ref 13.0–17.7)
Immature Grans (Abs): 0.1 10*3/uL (ref 0.0–0.1)
Immature Granulocytes: 1 %
Lymphocytes Absolute: 2.3 10*3/uL (ref 0.7–3.1)
Lymphs: 27 %
MCH: 31 pg (ref 26.6–33.0)
MCHC: 33.6 g/dL (ref 31.5–35.7)
MCV: 93 fL (ref 79–97)
Monocytes Absolute: 1 10*3/uL — ABNORMAL HIGH (ref 0.1–0.9)
Monocytes: 11 %
Neutrophils Absolute: 5 10*3/uL (ref 1.4–7.0)
Neutrophils: 58 %
Platelets: 316 10*3/uL (ref 150–450)
RBC: 4.93 x10E6/uL (ref 4.14–5.80)
RDW: 12.9 % (ref 11.6–15.4)
WBC: 8.5 10*3/uL (ref 3.4–10.8)

## 2023-02-15 LAB — LIPID PANEL
Chol/HDL Ratio: 4.3 {ratio} (ref 0.0–5.0)
Cholesterol, Total: 187 mg/dL (ref 100–199)
HDL: 44 mg/dL (ref >39)
LDL Chol Calc (NIH): 118 mg/dL — ABNORMAL HIGH (ref 0–99)
Triglycerides: 141 mg/dL (ref 0–149)
VLDL Cholesterol Cal: 25 mg/dL (ref 5–40)

## 2023-02-15 LAB — CMP14+EGFR
ALT: 19 [IU]/L (ref 0–44)
AST: 15 [IU]/L (ref 0–40)
Albumin: 4.4 g/dL (ref 3.8–4.8)
Alkaline Phosphatase: 67 [IU]/L (ref 44–121)
BUN/Creatinine Ratio: 16 (ref 10–24)
BUN: 14 mg/dL (ref 8–27)
Bilirubin Total: 0.2 mg/dL (ref 0.0–1.2)
CO2: 22 mmol/L (ref 20–29)
Calcium: 9.7 mg/dL (ref 8.6–10.2)
Chloride: 105 mmol/L (ref 96–106)
Creatinine, Ser: 0.86 mg/dL (ref 0.76–1.27)
Globulin, Total: 2.3 g/dL (ref 1.5–4.5)
Glucose: 80 mg/dL (ref 70–99)
Potassium: 4.4 mmol/L (ref 3.5–5.2)
Sodium: 140 mmol/L (ref 134–144)
Total Protein: 6.7 g/dL (ref 6.0–8.5)
eGFR: 91 mL/min/{1.73_m2} (ref >59)

## 2023-02-15 LAB — PSA, TOTAL AND FREE
PSA, Free Pct: 37.1 %
PSA, Free: 0.26 ng/mL
Prostate Specific Ag, Serum: 0.7 ng/mL (ref 0.0–4.0)

## 2023-02-15 LAB — TSH: TSH: 2.2 u[IU]/mL (ref 0.450–4.500)

## 2023-02-20 DIAGNOSIS — Z1211 Encounter for screening for malignant neoplasm of colon: Secondary | ICD-10-CM | POA: Diagnosis not present

## 2023-02-26 LAB — COLOGUARD: COLOGUARD: NEGATIVE

## 2023-04-14 DIAGNOSIS — T162XXA Foreign body in left ear, initial encounter: Secondary | ICD-10-CM | POA: Diagnosis not present

## 2023-05-05 ENCOUNTER — Ambulatory Visit (INDEPENDENT_AMBULATORY_CARE_PROVIDER_SITE_OTHER): Payer: Medicare Other

## 2023-05-05 VITALS — Ht 70.0 in | Wt 188.0 lb

## 2023-05-05 DIAGNOSIS — Z0000 Encounter for general adult medical examination without abnormal findings: Secondary | ICD-10-CM

## 2023-05-05 NOTE — Progress Notes (Signed)
 Subjective:   Christopher Herring is a 73 y.o. male who presents for Medicare Annual/Subsequent preventive examination.  Visit Complete: Virtual I connected with  Christopher Herring on 05/05/23 by a audio enabled telemedicine application and verified that I am speaking with the correct person using two identifiers.  Patient Location: Home  Provider Location: Home Office  Interactive audio and video telecommunications were attempted between this provider and patient, however failed, due to patient having technical difficulties OR patient did not have access to video capability.  We continued and completed visit with audio only.  I discussed the limitations of evaluation and management by telemedicine. The patient expressed understanding and agreed to proceed.  Vital Signs: Because this visit was a virtual/telehealth visit, some criteria may be missing or patient reported. Any vitals not documented were not able to be obtained and vitals that have been documented are patient reported.  Patient Medicare AWV questionnaire was completed by the patient on 05/02/23; I have confirmed that all information answered by patient is correct and no changes since this date.  Cardiac Risk Factors include: advanced age (>41men, >72 women);dyslipidemia;hypertension;male gender;smoking/ tobacco exposure     Objective:    Today's Vitals   05/05/23 1331  Weight: 188 lb (85.3 kg)  Height: 5' 10 (1.778 m)   Body mass index is 26.98 kg/m.     05/05/2023    1:48 PM 05/02/2022    9:13 AM 04/30/2021    6:22 PM 04/25/2021   10:13 AM  Advanced Directives  Does Patient Have a Medical Advance Directive? No Yes No No  Type of Special Educational Needs Teacher of [place];Living will    Copy of Healthcare Power of Attorney in Chart?  No - copy requested    Would patient like information on creating a medical advance directive? Yes (MAU/Ambulatory/Procedural Areas - Information given)  No - Patient declined No - Patient  declined    Current Medications (verified) Outpatient Encounter Medications as of 05/05/2023  Medication Sig   amLODipine  (NORVASC ) 5 MG tablet Take 1 tablet (5 mg total) by mouth daily.   rosuvastatin  (CRESTOR ) 5 MG tablet Take 1 tablet (5 mg total) by mouth daily.   acyclovir  (ZOVIRAX ) 400 MG tablet Take 1 tablet (400 mg total) by mouth 3 (three) times daily. x5-10 days (per cold sore episode) (Patient not taking: Reported on 05/05/2023)   No facility-administered encounter medications on file as of 05/05/2023.    Allergies (verified) Patient has no known allergies.   History: Past Medical History:  Diagnosis Date   Hypertension 05/08/2021   Past Surgical History:  Procedure Laterality Date   APPENDECTOMY  04/29/1974   BACK SURGERY  04/29/1994   L4-5   SPINE SURGERY  01/07/1996   Family History  Problem Relation Age of Onset   Diabetes Mother    Alzheimer's disease Mother    Heart disease Father    Cancer Sister    Social History   Socioeconomic History   Marital status: Divorced    Spouse name: Not on file   Number of children: 3   Years of education: 14   Highest education level: Some college, no degree  Occupational History   Not on file  Tobacco Use   Smoking status: Never    Passive exposure: Yes   Smokeless tobacco: Former    Types: Snuff  Vaping Use   Vaping status: Former  Substance and Sexual Activity   Alcohol use: Yes    Alcohol/week: 10.0 standard  drinks of alcohol    Types: 10 Glasses of wine per week   Drug use: No   Sexual activity: Not Currently    Birth control/protection: None  Other Topics Concern   Not on file  Social History Narrative   Not on file   Social Drivers of Health   Financial Resource Strain: Low Risk  (05/02/2023)   Overall Financial Resource Strain (CARDIA)    Difficulty of Paying Living Expenses: Not hard at all  Food Insecurity: No Food Insecurity (05/02/2023)   Hunger Vital Sign    Worried About Running Out of Food in  the Last Year: Never true    Ran Out of Food in the Last Year: Never true  Transportation Needs: No Transportation Needs (05/02/2023)   PRAPARE - Administrator, Civil Service (Medical): No    Lack of Transportation (Non-Medical): No  Physical Activity: Insufficiently Active (05/02/2023)   Exercise Vital Sign    Days of Exercise per Week: 2 days    Minutes of Exercise per Session: 20 min  Stress: No Stress Concern Present (05/02/2023)   [employer] of Occupational Health - Occupational Stress Questionnaire    Feeling of Stress : Not at all  Social Connections: Moderately Isolated (05/02/2023)   Social Connection and Isolation Panel [NHANES]    Frequency of Communication with Friends and Family: More than three times a week    Frequency of Social Gatherings with Friends and Family: Patient declined    Attends Religious Services: Never    Database Administrator or Organizations: No    Attends Engineer, Structural: Never    Marital Status: Living with partner    Tobacco Counseling Counseling given: Not Answered   Clinical Intake:  Pre-visit preparation completed: Yes  Pain : No/denies pain     Diabetes: No  How often do you need to have someone help you when you read instructions, pamphlets, or other written materials from your doctor or pharmacy?: 1 - Never  Interpreter Needed?: No  Information entered by :: Charmaine Bloodgood LPN   Activities of Daily Living    05/02/2023   12:50 PM  In your present state of health, do you have any difficulty performing the following activities:  Hearing? 0  Vision? 0  Difficulty concentrating or making decisions? 0  Walking or climbing stairs? 0  Dressing or bathing? 0  Doing errands, shopping? 0  Preparing Food and eating ? N  Using the Toilet? N  In the past six months, have you accidently leaked urine? N  Do you have problems with loss of bowel control? N  Managing your Medications? N  Managing your Finances?  N  Housekeeping or managing your Housekeeping? N    Patient Care Team: Dettinger, Fonda LABOR, MD as PCP - General (Family Medicine) Joshua Blamer, MD as Attending Physician (Dermatology) Ladora Ross Lacy Phebe, MD as Referring Physician (Optometry)  Indicate any recent Medical Services you may have received from other than [facility] providers in the past year (date may be approximate).     Assessment:   This is a routine wellness examination for Christopher Herring.  Hearing/Vision screen Hearing Screening - Comments:: Some hearing loss; doesn't wear hearing aids   Vision Screening - Comments:: No vision problems; will schedule routine eye exam soon     Goals Addressed             This Visit's Progress    Remain active and independent  Depression Screen    05/05/2023    1:46 PM 02/14/2023    3:49 PM 05/02/2022    9:11 AM 01/10/2022    1:38 PM 05/18/2021    8:49 AM 04/25/2021    9:54 AM 06/11/2019    1:02 PM  PHQ 2/9 Scores  PHQ - 2 Score 2 2 0 2 0 0 0  PHQ- 9 Score 4 4  3        Fall Risk    05/02/2023   12:50 PM 02/14/2023    3:49 PM 05/02/2022    9:10 AM 04/28/2022    5:00 PM 01/10/2022    1:38 PM  Fall Risk   Falls in the past year? 0 0 0 0 0  Number falls in past yr: 0  0 0   Injury with Fall? 0  0 0   Risk for fall due to : No Fall Risks  No Fall Risks    Follow up Falls evaluation completed;Education provided;Falls prevention discussed  Falls prevention discussed      MEDICARE RISK AT HOME: Medicare Risk at Home Any stairs in or around the home?: (Patient-Rptd) Yes If so, are there any without handrails?: (Patient-Rptd) Yes Home free of loose throw rugs in walkways, pet beds, electrical cords, etc?: (Patient-Rptd) Yes Adequate lighting in your home to reduce risk of falls?: (Patient-Rptd) Yes Life alert?: (Patient-Rptd) No Use of a cane, walker or w/c?: (Patient-Rptd) No Grab bars in the bathroom?: (Patient-Rptd) Yes Shower chair or bench in shower?: (Patient-Rptd)  No Elevated toilet seat or a handicapped toilet?: (Patient-Rptd) No  TIMED UP AND GO:  Was the test performed?  No    Cognitive Function:        05/05/2023    1:50 PM 05/02/2022    9:13 AM 04/25/2021   10:04 AM  6CIT Screen  What Year? 0 points 0 points 0 points  What month? 0 points 0 points 0 points  What time? 0 points 0 points 0 points  Count back from 20 0 points 0 points 0 points  Months in reverse 0 points 0 points 0 points  Repeat phrase 0 points 0 points 0 points  Total Score 0 points 0 points 0 points    Immunizations Immunization History  Administered Date(s) Administered   PFIZER(Purple Top)SARS-COV-2 Vaccination 07/04/2019, 07/25/2019   Zoster Recombinant(Shingrix ) 05/18/2021, 01/10/2022    TDAP status: Due, Education has been provided regarding the importance of this vaccine. Advised may receive this vaccine at local pharmacy or Health Dept. Aware to provide a copy of the vaccination record if obtained from local pharmacy or Health Dept. Verbalized acceptance and understanding.  Flu Vaccine status: Declined, Education has been provided regarding the importance of this vaccine but patient still declined. Advised may receive this vaccine at local pharmacy or Health Dept. Aware to provide a copy of the vaccination record if obtained from local pharmacy or Health Dept. Verbalized acceptance and understanding.  Pneumococcal vaccine status: Declined,  Education has been provided regarding the importance of this vaccine but patient still declined. Advised may receive this vaccine at local pharmacy or Health Dept. Aware to provide a copy of the vaccination record if obtained from local pharmacy or Health Dept. Verbalized acceptance and understanding.   Covid-19 vaccine status: Information provided on how to obtain vaccines.   Qualifies for Shingles Vaccine? Yes   Zostavax completed No   Shingrix  Completed?: Yes  Screening Tests Health Maintenance  Topic Date Due    Lung Cancer Screening  Never done   COVID-19 Vaccine (3 - Pfizer risk series) 08/22/2019   INFLUENZA VACCINE  07/28/2023 (Originally 11/28/2022)   DTaP/Tdap/Td (1 - Tdap) 02/14/2024 (Originally 09/04/1969)   Pneumonia Vaccine 47+ Years old (1 of 1 - PCV) 02/14/2024 (Originally 09/05/2014)   Medicare Annual Wellness (AWV)  05/04/2024   Fecal DNA (Cologuard)  02/19/2026   Hepatitis C Screening  Completed   Zoster Vaccines- Shingrix   Completed   HPV VACCINES  Aged Out   Colonoscopy  Discontinued    Health Maintenance  Health Maintenance Due  Topic Date Due   Lung Cancer Screening  Never done   COVID-19 Vaccine (3 - Pfizer risk series) 08/22/2019    Colorectal cancer screening: Type of screening: Cologuard. Completed 02/20/23. Repeat every 3 years  Lung Cancer Screening: (Low Dose CT Chest recommended if Age 9-80 years, 20 pack-year currently smoking OR have quit w/in 15years.) does qualify.   Lung Cancer Screening Referral: Declines at this time   Additional Screening:  Hepatitis C Screening: does qualify; Completed 06/11/19  Vision Screening: Recommended annual ophthalmology exams for early detection of glaucoma and other disorders of the eye. Is the patient up to date with their annual eye exam?  No  Who is the provider or what is the name of the office in which the patient attends annual eye exams? none If pt is not established with a provider, would they like to be referred to a provider to establish care? No .   Dental Screening: Recommended annual dental exams for proper oral hygiene  Community Resource Referral / Chronic Care Management: CRR required this visit?  No   CCM required this visit?  No     Plan:     I have personally reviewed and noted the following in the patient's chart:   Medical and social history Use of alcohol, tobacco or illicit drugs  Current medications and supplements including opioid prescriptions. Patient is not currently taking opioid  prescriptions. Functional ability and status Nutritional status Physical activity Advanced directives List of other physicians Hospitalizations, surgeries, and ER visits in previous 12 months Vitals Screenings to include cognitive, depression, and falls Referrals and appointments  In addition, I have reviewed and discussed with patient certain preventive protocols, quality metrics, and best practice recommendations. A written personalized care plan for preventive services as well as general preventive health recommendations were provided to patient.     Lavelle Pfeiffer [place], [place]   11/29/7972   After Visit Summary: (MyChart) Due to this being a telephonic visit, the after visit summary with patients personalized plan was offered to patient via MyChart   Nurse Notes: No concerns at this time

## 2023-05-05 NOTE — Patient Instructions (Signed)
 Mr. Casasola , Thank you for taking time to come for your Medicare Wellness Visit. I appreciate your ongoing commitment to your health goals. Please review the following plan we discussed and let me know if I can assist you in the future.   Referrals/Orders/Follow-Ups/Clinician Recommendations: Aim for 30 minutes of exercise or brisk walking, 6-8 glasses of water, and 5 servings of fruits and vegetables each day.   This is a list of the screening recommended for you and due dates:  Health Maintenance  Topic Date Due   Screening for Lung Cancer  Never done   COVID-19 Vaccine (3 - Pfizer risk series) 08/22/2019   Flu Shot  07/28/2023*   DTaP/Tdap/Td vaccine (1 - Tdap) 02/14/2024*   Pneumonia Vaccine (1 of 1 - PCV) 02/14/2024*   Medicare Annual Wellness Visit  05/04/2024   Cologuard (Stool DNA test)  02/19/2026   Hepatitis C Screening  Completed   Zoster (Shingles) Vaccine  Completed   HPV Vaccine  Aged Out   Colon Cancer Screening  Discontinued  *Topic was postponed. The date shown is not the original due date.    Advanced directives: (ACP Link)Information on Advanced Care Planning can be found at [place]  Secretary of [facility] Advance Health Care Directives Advance Health Care Directives ([email or web address]   Next Medicare Annual Wellness Visit scheduled for next year: Yes

## 2023-06-04 ENCOUNTER — Encounter: Payer: Self-pay | Admitting: *Deleted

## 2024-01-16 ENCOUNTER — Encounter: Payer: Self-pay | Admitting: Family Medicine

## 2024-01-16 ENCOUNTER — Ambulatory Visit: Payer: Medicare Other | Admitting: Family Medicine

## 2024-01-16 VITALS — BP 138/69 | HR 64 | Ht 70.0 in | Wt 186.0 lb

## 2024-01-16 DIAGNOSIS — E782 Mixed hyperlipidemia: Secondary | ICD-10-CM

## 2024-01-16 DIAGNOSIS — Z0000 Encounter for general adult medical examination without abnormal findings: Secondary | ICD-10-CM

## 2024-01-16 DIAGNOSIS — N400 Enlarged prostate without lower urinary tract symptoms: Secondary | ICD-10-CM | POA: Diagnosis not present

## 2024-01-16 DIAGNOSIS — B001 Herpesviral vesicular dermatitis: Secondary | ICD-10-CM | POA: Diagnosis not present

## 2024-01-16 DIAGNOSIS — I10 Essential (primary) hypertension: Secondary | ICD-10-CM

## 2024-01-16 LAB — LIPID PANEL

## 2024-01-16 MED ORDER — AMLODIPINE BESYLATE 5 MG PO TABS: 5.0000 mg | ORAL_TABLET | Freq: Every day | ORAL | 3 refills | Status: AC

## 2024-01-16 MED ORDER — ROSUVASTATIN CALCIUM 5 MG PO TABS: 5.0000 mg | ORAL_TABLET | Freq: Every day | ORAL | 3 refills | Status: AC

## 2024-01-16 MED ORDER — ACYCLOVIR 400 MG PO TABS: 400.0000 mg | ORAL_TABLET | Freq: Three times a day (TID) | ORAL | 3 refills | Status: AC

## 2024-01-16 NOTE — Progress Notes (Signed)
 BP 138/69   Pulse 64   Ht 5' 10 (1.778 m)   Wt 186 lb (84.4 kg)   SpO2 95%   BMI 26.69 kg/m    Subjective:   Patient ID: Christopher Herring, male    DOB: 19-Aug-1950, 73 y.o.   MRN: [id number]  HPI: Christopher Herring is a 73 y.o. male presenting on 01/16/2024 for Medical Management of Chronic Issues (CPE)   Discussed the use of AI scribe software for clinical note transcription with the patient, who gave verbal consent to proceed.  History of Present Illness   Christopher Herring is a 73 year old male who presents for an annual physical exam.  He notes an improvement in his blood pressure. He occasionally checks his blood pressure at home but not frequently.  He continues to take Crestor  for cholesterol management without side effects. He is scheduled for a cholesterol check today.  He uses Aciclovir as needed for cold sores and has not had an outbreak for a year. He mentions having some medication left, although it may be outdated.  He has never received a flu shot and does not plan to start now. He declines a tetanus shot this year, despite working outside on his deck, and plans to reconsider next year.  He discusses a cyst that has been present for some time, noting that it has not changed and does not bother him.  He mentions a recent loss of a friend to cancer and reflects on the unpredictability of the disease. He shares that he has lost two friends to pancreatic cancer.  No urinary symptoms or bowel issues. He opts for prostate cancer screening through blood work rather than a physical exam.          Relevant past medical, surgical, family and social history reviewed and updated as indicated. Interim medical history since our last visit reviewed. Allergies and medications reviewed and updated.  Review of Systems  Constitutional:  Negative for chills and fever.  HENT:  Negative for ear pain and tinnitus.   Eyes:  Negative for pain.  Respiratory:  Negative for cough, shortness of  breath and wheezing.   Cardiovascular:  Negative for chest pain, palpitations and leg swelling.  Gastrointestinal:  Negative for abdominal pain, blood in stool, constipation and diarrhea.  Genitourinary:  Negative for dysuria and hematuria.  Musculoskeletal:  Negative for back pain and myalgias.  Skin:  Negative for rash.  Neurological:  Negative for dizziness, weakness and headaches.  Psychiatric/Behavioral:  Negative for suicidal ideas.     Per HPI unless specifically indicated above   Allergies as of 01/16/2024   No Known Allergies      Medication List        Accurate as of January 16, 2024 10:28 AM. If you have any questions, ask your nurse or doctor.          acyclovir  400 MG tablet Commonly known as: ZOVIRAX  Take 1 tablet (400 mg total) by mouth 3 (three) times daily. x5-10 days (per cold sore episode)   amLODipine  5 MG tablet Commonly known as: NORVASC  Take 1 tablet (5 mg total) by mouth daily.   rosuvastatin  5 MG tablet Commonly known as: CRESTOR  Take 1 tablet (5 mg total) by mouth daily.         Objective:   BP 138/69   Pulse 64   Ht 5' 10 (1.778 m)   Wt 186 lb (84.4 kg)   SpO2 95%   BMI 26.69  kg/m   Wt Readings from Last 3 Encounters:  01/16/24 186 lb (84.4 kg)  05/05/23 188 lb (85.3 kg)  02/14/23 188 lb (85.3 kg)    Physical Exam Vitals and nursing note reviewed.  Skin:       Physical Exam   VITALS: BP- 133/70 NECK: Neck without swelling. CHEST: Lungs clear to auscultation bilaterally. CARDIOVASCULAR: Heart regular rate and rhythm, no murmurs. EXTREMITIES: Pulses intact bilaterally.         Assessment & Plan:   Problem List Items Addressed This Visit       Cardiovascular and Mediastinum   Hypertension   Relevant Medications   amLODipine  (NORVASC ) 5 MG tablet   rosuvastatin  (CRESTOR ) 5 MG tablet   Other Relevant Orders   CBC with Differential/Platelet   CMP14+EGFR     Digestive   Herpes labialis without complication    Relevant Medications   acyclovir  (ZOVIRAX ) 400 MG tablet     Other   Hyperlipidemia   Relevant Medications   amLODipine  (NORVASC ) 5 MG tablet   rosuvastatin  (CRESTOR ) 5 MG tablet   Other Relevant Orders   CBC with Differential/Platelet   CMP14+EGFR   Lipid panel   Enlarged prostate   Relevant Orders   PSA, total and free   Other Visit Diagnoses       Physical exam    -  Primary   Relevant Orders   CBC with Differential/Platelet   CMP14+EGFR   Lipid panel   PSA, total and free          Adult Wellness Visit Routine visit with well-controlled blood pressure. No new issues. Declined flu and tetanus vaccinations. - Order blood work for routine screening. - Discuss results via MyChart or phone call.  Essential hypertension Hypertension well-controlled at 133/70 mmHg.  Mixed hyperlipidemia Continues Crestor  with no side effects. Previous cholesterol levels slightly elevated. - Order cholesterol panel. - Review results via MyChart or phone call.  Benign prostatic hyperplasia without lower urinary tract symptoms Benign prostatic hyperplasia without urinary symptoms. Opted for blood work over physical exam.  Herpes labialis (cold sores) Managed with acyclovir  as needed. No outbreaks in the past year. - Refill acyclovir  prescription and hold at pharmacy.  Epidermal cyst Epidermal cyst stable with no changes.  Lung cancer screening Discussed importance of early detection for effective treatment. - Referral for lung cancer screening after the first of the year.          Follow up plan: Return in about 1 year (around 01/15/2025), or if symptoms worsen or fail to improve, for Physical exam.  Counseling provided for all of the vaccine components Orders Placed This Encounter  Procedures   CBC with Differential/Platelet   CMP14+EGFR   Lipid panel   PSA, total and free    Fonda Levins, MD [facility] 01/16/2024, 10:28 AM

## 2024-01-17 LAB — CMP14+EGFR
ALT: 16 IU/L (ref 0–44)
AST: 18 IU/L (ref 0–40)
Albumin: 4.4 g/dL (ref 3.8–4.8)
Alkaline Phosphatase: 58 IU/L (ref 47–123)
BUN/Creatinine Ratio: 11 (ref 10–24)
BUN: 8 mg/dL (ref 8–27)
Bilirubin Total: 0.3 mg/dL (ref 0.0–1.2)
CO2: 21 mmol/L (ref 20–29)
Calcium: 9.1 mg/dL (ref 8.6–10.2)
Chloride: 103 mmol/L (ref 96–106)
Creatinine, Ser: 0.74 mg/dL — ABNORMAL LOW (ref 0.76–1.27)
Globulin, Total: 2.2 g/dL (ref 1.5–4.5)
Glucose: 103 mg/dL — ABNORMAL HIGH (ref 70–99)
Potassium: 4.1 mmol/L (ref 3.5–5.2)
Sodium: 138 mmol/L (ref 134–144)
Total Protein: 6.6 g/dL (ref 6.0–8.5)
eGFR: 95 mL/min/1.73 (ref >59)

## 2024-01-17 LAB — LIPID PANEL
Chol/HDL Ratio: 3.2 ratio (ref 0.0–5.0)
Cholesterol, Total: 163 mg/dL (ref 100–199)
HDL: 51 mg/dL (ref >39)
LDL Chol Calc (NIH): 89 mg/dL (ref 0–99)
Triglycerides: 133 mg/dL (ref 0–149)
VLDL Cholesterol Cal: 23 mg/dL (ref 5–40)

## 2024-01-17 LAB — CBC WITH DIFFERENTIAL/PLATELET
Basophils Absolute: 0 x10E3/uL (ref 0.0–0.2)
Basos: 1 %
EOS (ABSOLUTE): 0.1 x10E3/uL (ref 0.0–0.4)
Eos: 1 %
Hematocrit: 45.1 % (ref 37.5–51.0)
Hemoglobin: 14.8 g/dL (ref 13.0–17.7)
Immature Grans (Abs): 0.1 x10E3/uL (ref 0.0–0.1)
Immature Granulocytes: 1 %
Lymphocytes Absolute: 2.1 x10E3/uL (ref 0.7–3.1)
Lymphs: 25 %
MCH: 30.6 pg (ref 26.6–33.0)
MCHC: 32.8 g/dL (ref 31.5–35.7)
MCV: 93 fL (ref 79–97)
Monocytes Absolute: 0.9 x10E3/uL (ref 0.1–0.9)
Monocytes: 10 %
Neutrophils Absolute: 5.2 x10E3/uL (ref 1.4–7.0)
Neutrophils: 62 %
Platelets: 287 x10E3/uL (ref 150–450)
RBC: 4.84 x10E6/uL (ref 4.14–5.80)
RDW: 13.2 % (ref 11.6–15.4)
WBC: 8.4 x10E3/uL (ref 3.4–10.8)

## 2024-01-17 LAB — PSA, TOTAL AND FREE
PSA, Free Pct: 34.3 %
PSA, Free: 0.24 ng/mL
Prostate Specific Ag, Serum: 0.7 ng/mL (ref 0.0–4.0)

## 2024-01-21 ENCOUNTER — Ambulatory Visit: Payer: Self-pay | Admitting: Family Medicine

## 2025-01-17 ENCOUNTER — Encounter: Payer: Self-pay | Admitting: Family Medicine
